# Patient Record
Sex: Female | Born: 1985 | Race: Black or African American | Hispanic: No | Marital: Married | Smoking: Never smoker
Health system: Southern US, Community
[De-identification: ages and names within clinical notes are randomized; demographics above are authoritative.]

## PROBLEM LIST (undated history)

## (undated) DIAGNOSIS — Z973 Presence of spectacles and contact lenses: Secondary | ICD-10-CM

## (undated) DIAGNOSIS — O169 Unspecified maternal hypertension, unspecified trimester: Secondary | ICD-10-CM

## (undated) HISTORY — DX: Presence of spectacles and contact lenses: Z97.3

## (undated) HISTORY — PX: TUBAL LIGATION: SHX77

## (undated) HISTORY — DX: Unspecified maternal hypertension, unspecified trimester: O16.9

---

## 2006-08-13 ENCOUNTER — Inpatient Hospital Stay (HOSPITAL_COMMUNITY): Admission: AD | Admit: 2006-08-13 | Discharge: 2006-08-18 | Payer: Self-pay | Admitting: Obstetrics and Gynecology

## 2006-08-13 ENCOUNTER — Encounter: Payer: Self-pay | Admitting: Obstetrics & Gynecology

## 2006-08-13 ENCOUNTER — Ambulatory Visit: Payer: Self-pay | Admitting: Pediatrics

## 2006-08-14 ENCOUNTER — Encounter: Payer: Self-pay | Admitting: Obstetrics & Gynecology

## 2006-08-15 ENCOUNTER — Encounter (INDEPENDENT_AMBULATORY_CARE_PROVIDER_SITE_OTHER): Payer: Self-pay | Admitting: Specialist

## 2006-08-15 ENCOUNTER — Encounter: Payer: Self-pay | Admitting: Obstetrics & Gynecology

## 2006-08-19 ENCOUNTER — Encounter: Admission: RE | Admit: 2006-08-19 | Discharge: 2006-09-18 | Payer: Self-pay | Admitting: Obstetrics and Gynecology

## 2006-09-19 ENCOUNTER — Encounter: Admission: RE | Admit: 2006-09-19 | Discharge: 2006-09-28 | Payer: Self-pay | Admitting: Obstetrics and Gynecology

## 2009-09-18 DIAGNOSIS — O169 Unspecified maternal hypertension, unspecified trimester: Secondary | ICD-10-CM

## 2009-09-18 HISTORY — DX: Unspecified maternal hypertension, unspecified trimester: O16.9

## 2009-11-25 ENCOUNTER — Ambulatory Visit (HOSPITAL_COMMUNITY): Admission: RE | Admit: 2009-11-25 | Discharge: 2009-11-25 | Payer: Self-pay | Admitting: Obstetrics and Gynecology

## 2009-12-10 ENCOUNTER — Ambulatory Visit (HOSPITAL_COMMUNITY): Admission: RE | Admit: 2009-12-10 | Discharge: 2009-12-10 | Payer: Self-pay | Admitting: Obstetrics and Gynecology

## 2010-01-06 ENCOUNTER — Ambulatory Visit (HOSPITAL_COMMUNITY): Admission: RE | Admit: 2010-01-06 | Discharge: 2010-01-06 | Payer: Self-pay | Admitting: Obstetrics and Gynecology

## 2010-03-07 ENCOUNTER — Emergency Department (HOSPITAL_COMMUNITY): Admission: EM | Admit: 2010-03-07 | Discharge: 2010-03-08 | Payer: Self-pay | Admitting: Emergency Medicine

## 2010-03-08 ENCOUNTER — Ambulatory Visit: Payer: Self-pay | Admitting: Nurse Practitioner

## 2010-03-09 ENCOUNTER — Inpatient Hospital Stay (HOSPITAL_COMMUNITY): Admission: AD | Admit: 2010-03-09 | Discharge: 2010-03-09 | Payer: Self-pay | Admitting: *Deleted

## 2010-03-10 ENCOUNTER — Ambulatory Visit (HOSPITAL_COMMUNITY): Admission: AD | Admit: 2010-03-10 | Discharge: 2010-03-10 | Payer: Self-pay | Admitting: Obstetrics and Gynecology

## 2010-03-14 ENCOUNTER — Ambulatory Visit (HOSPITAL_COMMUNITY): Admission: RE | Admit: 2010-03-14 | Discharge: 2010-03-14 | Payer: Self-pay | Admitting: Obstetrics and Gynecology

## 2010-03-22 ENCOUNTER — Inpatient Hospital Stay (HOSPITAL_COMMUNITY): Admission: AD | Admit: 2010-03-22 | Discharge: 2010-04-08 | Payer: Self-pay | Admitting: Obstetrics and Gynecology

## 2010-03-22 ENCOUNTER — Encounter: Payer: Self-pay | Admitting: Obstetrics and Gynecology

## 2010-03-25 ENCOUNTER — Encounter: Payer: Self-pay | Admitting: Obstetrics and Gynecology

## 2010-03-28 ENCOUNTER — Encounter: Payer: Self-pay | Admitting: Obstetrics and Gynecology

## 2010-03-31 ENCOUNTER — Encounter: Payer: Self-pay | Admitting: Obstetrics and Gynecology

## 2010-04-04 ENCOUNTER — Encounter: Payer: Self-pay | Admitting: Obstetrics and Gynecology

## 2010-04-05 ENCOUNTER — Encounter (INDEPENDENT_AMBULATORY_CARE_PROVIDER_SITE_OTHER): Payer: Self-pay | Admitting: Obstetrics and Gynecology

## 2010-04-08 ENCOUNTER — Encounter: Admission: RE | Admit: 2010-04-08 | Discharge: 2010-05-08 | Payer: Self-pay | Admitting: Obstetrics and Gynecology

## 2010-07-18 ENCOUNTER — Ambulatory Visit: Payer: Self-pay | Admitting: Family Medicine

## 2010-07-29 ENCOUNTER — Ambulatory Visit: Payer: Self-pay | Admitting: Family Medicine

## 2010-08-25 ENCOUNTER — Inpatient Hospital Stay (HOSPITAL_COMMUNITY): Admission: AD | Admit: 2010-08-25 | Discharge: 2010-03-08 | Payer: Self-pay | Admitting: Obstetrics and Gynecology

## 2010-08-25 ENCOUNTER — Inpatient Hospital Stay (HOSPITAL_COMMUNITY): Admission: AD | Admit: 2010-08-25 | Discharge: 2009-10-22 | Payer: Self-pay | Admitting: Obstetrics and Gynecology

## 2010-10-09 ENCOUNTER — Encounter: Payer: Self-pay | Admitting: Obstetrics & Gynecology

## 2010-12-03 LAB — CBC
HCT: 40.4 % (ref 36.0–46.0)
Hemoglobin: 12.1 g/dL (ref 12.0–15.0)
MCH: 32.4 pg (ref 26.0–34.0)
MCHC: 34.9 g/dL (ref 30.0–36.0)
MCV: 92.1 fL (ref 78.0–100.0)
RDW: 13.4 % (ref 11.5–15.5)
RDW: 13.7 % (ref 11.5–15.5)
WBC: 10 10*3/uL (ref 4.0–10.5)

## 2010-12-04 LAB — URINALYSIS, ROUTINE W REFLEX MICROSCOPIC
Bilirubin Urine: NEGATIVE
Bilirubin Urine: NEGATIVE
Glucose, UA: NEGATIVE mg/dL
Glucose, UA: NEGATIVE mg/dL
Glucose, UA: NEGATIVE mg/dL
Hgb urine dipstick: NEGATIVE
Ketones, ur: NEGATIVE mg/dL
Ketones, ur: NEGATIVE mg/dL
Ketones, ur: NEGATIVE mg/dL
Leukocytes, UA: NEGATIVE
Protein, ur: NEGATIVE mg/dL
Protein, ur: NEGATIVE mg/dL
pH: 7.5 (ref 5.0–8.0)
pH: 6 (ref 5.0–8.0)

## 2010-12-04 LAB — COMPREHENSIVE METABOLIC PANEL
ALT: 11 U/L (ref 0–35)
ALT: 12 U/L (ref 0–35)
ALT: 12 U/L (ref 0–35)
Albumin: 3.1 g/dL — ABNORMAL LOW (ref 3.5–5.2)
Alkaline Phosphatase: 202 U/L — ABNORMAL HIGH (ref 39–117)
BUN: 2 mg/dL — ABNORMAL LOW (ref 6–23)
BUN: 3 mg/dL — ABNORMAL LOW (ref 6–23)
BUN: 4 mg/dL — ABNORMAL LOW (ref 6–23)
CO2: 23 mEq/L (ref 19–32)
CO2: 25 mEq/L (ref 19–32)
Calcium: 8.4 mg/dL (ref 8.4–10.5)
Calcium: 8.5 mg/dL (ref 8.4–10.5)
Calcium: 8.7 mg/dL (ref 8.4–10.5)
Chloride: 107 mEq/L (ref 96–112)
Creatinine, Ser: 0.6 mg/dL (ref 0.4–1.2)
Creatinine, Ser: 0.61 mg/dL (ref 0.4–1.2)
GFR calc Af Amer: >60 mL/min (ref >60)
GFR calc non Af Amer: >60 mL/min (ref >60)
GFR calc non Af Amer: >60 mL/min (ref >60)
GFR calc non Af Amer: >60 mL/min (ref >60)
GFR calc non Af Amer: >60 mL/min (ref >60)
Glucose, Bld: 71 mg/dL (ref 70–99)
Glucose, Bld: 86 mg/dL (ref 70–99)
Glucose, Bld: 91 mg/dL (ref 70–99)
Potassium: 4.1 mEq/L (ref 3.5–5.1)
Sodium: 136 mEq/L (ref 135–145)
Sodium: 138 mEq/L (ref 135–145)
Sodium: 139 mEq/L (ref 135–145)
Total Bilirubin: 0.2 mg/dL — ABNORMAL LOW (ref 0.3–1.2)
Total Bilirubin: 0.5 mg/dL (ref 0.3–1.2)
Total Protein: 5.9 g/dL — ABNORMAL LOW (ref 6.0–8.3)

## 2010-12-04 LAB — LACTATE DEHYDROGENASE
LDH: 120 U/L (ref 94–250)
LDH: 192 U/L (ref 94–250)

## 2010-12-04 LAB — STREP B DNA PROBE

## 2010-12-04 LAB — CBC
HCT: 38.4 % (ref 36.0–46.0)
HCT: 38.8 % (ref 36.0–46.0)
HCT: 41.8 % (ref 36.0–46.0)
Hemoglobin: 13.3 g/dL (ref 12.0–15.0)
Hemoglobin: 13.4 g/dL (ref 12.0–15.0)
Hemoglobin: 15.3 g/dL — ABNORMAL HIGH (ref 12.0–15.0)
MCH: 31.6 pg (ref 26.0–34.0)
MCH: 31.8 pg (ref 26.0–34.0)
MCHC: 34.5 g/dL (ref 30.0–36.0)
MCHC: 34.5 g/dL (ref 30.0–36.0)
MCHC: 34.7 g/dL (ref 30.0–36.0)
MCHC: 34.8 g/dL (ref 30.0–36.0)
MCV: 91.5 fL (ref 78.0–100.0)
MCV: 91.8 fL (ref 78.0–100.0)
MCV: 90.1 fL (ref 78.0–100.0)
Platelets: 191 10*3/uL (ref 150–400)
RBC: 4.82 MIL/uL (ref 3.87–5.11)
RBC: 4.12 MIL/uL (ref 3.87–5.11)
RDW: 12.7 % (ref 11.5–15.5)
RDW: 13.2 % (ref 11.5–15.5)
WBC: 10.9 10*3/uL — ABNORMAL HIGH (ref 4.0–10.5)

## 2010-12-04 LAB — PROTEIN, URINE, RANDOM: Total Protein, Urine: <3 mg/dL

## 2010-12-04 LAB — CREATININE CLEARANCE, URINE, 24 HOUR
Collection Interval-CRCL: 24 hours
Creatinine: 0.61 mg/dL (ref 0.4–1.2)
Urine Total Volume-CRCL: 3175 mL

## 2010-12-04 LAB — PROTEIN, URINE, 24 HOUR
Collection Interval-UPROT: 24 hours
Protein, Urine: 3 mg/dL

## 2010-12-07 LAB — COMPREHENSIVE METABOLIC PANEL
Alkaline Phosphatase: 67 U/L (ref 39–117)
BUN: 5 mg/dL — ABNORMAL LOW (ref 6–23)
Chloride: 102 mEq/L (ref 96–112)
Glucose, Bld: 101 mg/dL — ABNORMAL HIGH (ref 70–99)
Potassium: 3.6 mEq/L (ref 3.5–5.1)
Total Bilirubin: 0.5 mg/dL (ref 0.3–1.2)

## 2010-12-07 LAB — CBC
HCT: 39.6 % (ref 36.0–46.0)
Hemoglobin: 13.5 g/dL (ref 12.0–15.0)
WBC: 13.9 10*3/uL — ABNORMAL HIGH (ref 4.0–10.5)

## 2010-12-07 LAB — URINALYSIS, ROUTINE W REFLEX MICROSCOPIC
Hgb urine dipstick: NEGATIVE
Protein, ur: NEGATIVE mg/dL
Urobilinogen, UA: 0.2 mg/dL (ref 0.0–1.0)

## 2011-02-03 NOTE — Op Note (Signed)
NAME:  Jodi Sanchez, Jodi Sanchez             ACCOUNT NO.:  [id number]   MEDICAL RECORD NO.:  [id number]          PATIENT TYPE:  OUT   LOCATION:  MFM                           FACILITY:  [facility]   PHYSICIAN:  Ilda Mori, M.D.   DATE OF BIRTH:  09/26/1985   DATE OF PROCEDURE:  DATE OF DISCHARGE:                               OPERATIVE REPORT   PREOPERATIVE DIAGNOSIS:  Intrauterine fetal growth restriction, reversed  flow umbilical cord Doppler studies and nonreassuring fetal heart rate  tracing.   POSTOPERATIVE DIAGNOSIS:  Intrauterine fetal growth restriction,  reversed flow umbilical cord Doppler studies and nonreassuring fetal  heart rate tracing.   PROCEDURE:  Primary low transverse cesarean section.   SURGEON:  Dr. Ilda Mori.   ANESTHESIA:  Spinal.   ESTIMATED BLOOD LOSS:  500 mL.   FINDINGS:  Female infant, Apgar scores 7 and 8, birth weight 1 pound 14  ounces (850 grams).  Arterial cord pH 7.21.   SPECIMENS:  Placenta to pathology.   INDICATIONS:  This is a 25 year old primigravid female who was admitted  to the hospital on November26 because of fetal growth restriction,  abnormal fetal cord Doppler studies and oligohydramnios.  The patient  was given a betamethasone protocol to enhance fetal lung maturity and  fetal heart tones were continuously monitored.  Repeat ultrasound showed  borderline oligohydramnios and persistent reverse flow on her umbilical  cord Doppler studies.  After maximum benefit from the steroid protocol  had been obtained the decision was made to deliver the baby due to  severe IUGR, persistent oligohydramnios, and reverse flow on her doppler  studies.  Because the fetal heart tones were reactive and her  biophysical profile were reassuring, a trial of induced labor was  attempted.  At around 1515 the nurse noted some variable decelerations  but due to the small uterine size her contractions were not recording on  external tocometric monitoring and  the timing of the decels could not be  defined.  Her cervix was examined and was found to be 1+ centimeters  dilated, 80% effaced with vertex well applied to the cervix at -1  station.  This was a significant change from the previous exam which was  a tight fingertip and 30%.  Artificial rupture of membranes was  performed which revealed clear fluid.  An IUPC was placed and a scalp  electrode was applied.  Following this severe variable decels occurred  and the the decision was made to proceed with immediate cesarean  section.  The pitocin was discontinued, terbutaline 0.25 mg was given  subcu and the patient was taken immediately to the operating room for  delivery.   PROCEDURE:  The patient was taken to the operating room, placed in  sitting position where a spinal anesthetic was placed.  Continuous fetal  monitoring during this procedure showed the fetal heart rates were  stable and in the 150-160 range with no decelerations and no uterine  contraction. The scalp electrode was then removed.  The abdomen was  prepped and draped in sterile fashion.  The bladder was catheterized.  A  low transverse  incision was made and carried down to the fascia which  was extended transversely.  The rectus sheath was then dissected  superiorly until the rectus midline could be identified.  This was then  opened and extended bluntly.  The peritoneal cavity was entered by sharp  and blunt dissection and extended bluntly.  The lower segment  identified.  A low transverse unterine incision was made and extended  bluntly.  The infant was then delivered without difficulty within the  amniotic sac.  Arterial cord pH and routine venous cord blood was  obtained. The placenta was then delivered with traction on cord and sent  for pathological evaluation.  The endometrial cavity was then bluntly  curettaged.  The lower segment was closed with single layer of running  interlocking Vicryl suture.  Hemostasis was  obtained with a vertical  mattress suture.  The adnexa were inspected and were normal.  The  perineum was then closed with a running 3-0 Vicryl suture which included  re-apposing the rectus muscle in the midline.  The fascia was then  closed with running 0 Vicryl suture and skin was closed with staples.  The patient tolerated the procedure well and left the operating room  good condition.      Ilda Mori, M.D.  Electronically Signed     RK/MEDQ  D:  08/15/2006  T:  08/16/2006  Job:  [id number]   cc:   Felipa Eth, MD

## 2011-02-03 NOTE — Discharge Summary (Signed)
Jodi Sanchez, Jodi Sanchez             ACCOUNT NO.:  [id number]   MEDICAL RECORD NO.:  [id number]          PATIENT TYPE:  INP   LOCATION:  9309                          FACILITY:  [facility]   PHYSICIAN:  Gerrit Friends. Aldona Bar, M.D.   DATE OF BIRTH:  May 30, 1986   DATE OF ADMISSION:  08/13/2006  DATE OF DISCHARGE:  08/18/2006                               DISCHARGE SUMMARY   DISCHARGE DIAGNOSIS:  1. A 29-week intrauterine pregnancy. Delivered 1 pound 14 ounce female      infant, Apgars 7 and 8.  2. Blood type O+.  3. Severe intrauterine growth restriction with placental      insufficiency.   PROCEDURE:  Primary low-transverse cesarean section.   SUMMARY:  This 25 year old, gravida 1, now para 1, with a due date of  October 23, 2006, based on an office ultrasound on June 08, 2006,  at which time the patient discovered that she was pregnant. Was admitted  by Dr. Edward Jolly to evaluate probable severe IUGR and oligohydramnios with  consultation with maternal fetal medicine.  The patient, as mentioned,  had late prenatal care: Did not really know she was pregnant until the  first office visit on June 08, 2006.  Subsequently, she was seen in  the office on October9, November6, and on the day of admission,  November26.  On the day of admission, she measured 25 cm at 29 weeks  and 6 days, and estimated fetal weight on ultrasound was less than 10th  percentile, and there was an amniotic fluid index that suggested  oligohydramnios.  She was admitted, seen by maternal fetal medicine, and  found to have reversal of end-diastolic flow, although her biophysical  profile was 8 of 8.  A course of steroids was given and completed on  November 28.  Daily biophysical profiles and Doppler studies were  unchanged.   Decision was made to proceed with delivery on September 28.  Amniotomy  was carried out, an IUPC was placed, and the patient was begun on  Pitocin; but she quickly developed some deep decelerations  with each  contraction, and Pitocin was discontinued. Terbutaline was given  subcutaneously, and the patient was taken to the operating room at which  time she underwent a primary low transverse cesarean section with  delivery of a 1 pound 14 ounce female infant with Apgar hours of 7 and  8.  Arterial cord pH was 7.21.  Placenta was sent to pathology.   The patient's post operative course was totally benign.  Her postop  hemoglobin was 11.5 with a white count 15,300 and a platelet count of  239,000.  She did very well post C-section and on the morning of  December 1 was ambulating well, tolerating a regular diet well, having  normal bowel and bladder function, was afebrile.  Her wound was clean  and dry and healing well. Her breast pumping was doing great, and after  arrangements with the nursery, she was desirous of discharge.  Accordingly, her staples were removed, and wound was Steri-Stripped.  She was given all appropriate instructions at the time of discharge and  understood all instructions well.   DISCHARGE MEDICATIONS:  1. Prenatal vitamins One-A-Day.  2. Tylox 1-2 every 4-6 hours as needed for severe pain.  3. Motrin 600 mg every 6 hours as needed for lesser pain or cramping.   She will return to the office followup in approximately four weeks' time  and was given all appropriate instructions at the time of discharge per  discharge brochure.   Baby was doing relatively well in the nursery considering gestational  age.   CONDITION ON DISCHARGE:  Improved.      Gerrit Friends. Aldona Bar, M.D.  Electronically Signed     RMW/MEDQ  D:  08/18/2006  T:  08/19/2006  Job:  [id number]   cc:   Felipa Eth, MD

## 2011-03-31 ENCOUNTER — Other Ambulatory Visit: Payer: Self-pay | Admitting: Obstetrics and Gynecology

## 2011-03-31 LAB — ANTIBODY SCREEN: Antibody Screen: NEGATIVE

## 2011-03-31 LAB — HEPATITIS B SURFACE ANTIGEN: Hepatitis B Surface Ag: NEGATIVE

## 2011-03-31 LAB — RPR: RPR: NONREACTIVE

## 2011-03-31 LAB — ABO/RH: RH Type: POSITIVE

## 2011-03-31 LAB — RUBELLA ANTIBODY, IGM: Rubella: IMMUNE

## 2011-05-31 ENCOUNTER — Encounter: Payer: Self-pay | Admitting: Family Medicine

## 2011-10-20 ENCOUNTER — Other Ambulatory Visit: Payer: Self-pay | Admitting: Obstetrics and Gynecology

## 2011-10-24 ENCOUNTER — Encounter (HOSPITAL_COMMUNITY): Payer: Self-pay | Admitting: Pharmacist

## 2011-10-24 ENCOUNTER — Other Ambulatory Visit: Payer: Self-pay | Admitting: Obstetrics and Gynecology

## 2011-10-31 ENCOUNTER — Encounter (HOSPITAL_COMMUNITY)
Admission: RE | Admit: 2011-10-31 | Discharge: 2011-10-31 | Disposition: A | Discharge status: Home / Self Care | Payer: BC Managed Care – PPO | Source: Ambulatory Visit | Attending: Obstetrics and Gynecology | Admitting: Obstetrics and Gynecology

## 2011-10-31 ENCOUNTER — Encounter (HOSPITAL_COMMUNITY): Payer: Self-pay

## 2011-10-31 LAB — CBC
HCT: 35.8 % — ABNORMAL LOW (ref 36.0–46.0)
Hemoglobin: 12.2 g/dL (ref 12.0–15.0)
MCV: 86.5 fL (ref 78.0–100.0)
RBC: 4.14 MIL/uL (ref 3.87–5.11)
WBC: 7.9 10*3/uL (ref 4.0–10.5)

## 2011-10-31 LAB — RPR: RPR Ser Ql: NONREACTIVE

## 2011-10-31 NOTE — Patient Instructions (Addendum)
20 Jodi Sanchez  10/31/2011   Your procedure is scheduled on:  11/01/11  Enter through the Main Entrance of [facility] at 10 AM.  Pick up the phone at the desk and dial [phone].   Call this number if you have problems the morning of surgery: [phone]   Remember:   Do not eat food:After Midnight.  Do not drink clear liquids: After Midnight.  Take these medicines the morning of surgery with A SIP OF WATER: NA   Do not wear jewelry, make-up or nail polish.  Do not wear lotions, powders, or perfumes. You may wear deodorant.  Do not shave 48 hours prior to surgery.  Do not bring valuables to the hospital.  Contacts, dentures or bridgework may not be worn into surgery.  Leave suitcase in the car. After surgery it may be brought to your room.  For patients admitted to the hospital, checkout time is 11:00 AM the day of discharge.   Patients discharged the day of surgery will not be allowed to drive home.  Name and phone number of your driver: NA  Special Instructions: CHG Shower Use Special Wash: 1/2 bottle night before surgery and 1/2 bottle morning of surgery.   Please read over the following fact sheets that you were given: MRSA Information

## 2011-11-01 ENCOUNTER — Encounter (HOSPITAL_COMMUNITY): Payer: Self-pay | Admitting: *Deleted

## 2011-11-01 ENCOUNTER — Inpatient Hospital Stay (HOSPITAL_COMMUNITY): Payer: BC Managed Care – PPO | Admitting: Anesthesiology

## 2011-11-01 ENCOUNTER — Encounter (HOSPITAL_COMMUNITY): Payer: Self-pay | Admitting: Anesthesiology

## 2011-11-01 ENCOUNTER — Inpatient Hospital Stay (HOSPITAL_COMMUNITY)
Admission: RE | Admit: 2011-11-01 | Discharge: 2011-11-04 | DRG: 370 | Disposition: A | Discharge status: Home / Self Care | Payer: BC Managed Care – PPO | Source: Ambulatory Visit | Attending: Obstetrics and Gynecology | Admitting: Obstetrics and Gynecology

## 2011-11-01 ENCOUNTER — Encounter (HOSPITAL_COMMUNITY)
Admission: RE | Disposition: A | Discharge status: Home / Self Care | Payer: Self-pay | Source: Ambulatory Visit | Attending: Obstetrics and Gynecology

## 2011-11-01 DIAGNOSIS — D649 Anemia, unspecified: Secondary | ICD-10-CM | POA: Diagnosis not present

## 2011-11-01 DIAGNOSIS — O9903 Anemia complicating the puerperium: Secondary | ICD-10-CM | POA: Diagnosis not present

## 2011-11-01 DIAGNOSIS — Z3480 Encounter for supervision of other normal pregnancy, unspecified trimester: Secondary | ICD-10-CM

## 2011-11-01 DIAGNOSIS — O34219 Maternal care for unspecified type scar from previous cesarean delivery: Principal | ICD-10-CM | POA: Diagnosis present

## 2011-11-01 DIAGNOSIS — Z01812 Encounter for preprocedural laboratory examination: Secondary | ICD-10-CM

## 2011-11-01 DIAGNOSIS — Z302 Encounter for sterilization: Secondary | ICD-10-CM

## 2011-11-01 DIAGNOSIS — Z01818 Encounter for other preprocedural examination: Secondary | ICD-10-CM

## 2011-11-01 LAB — TYPE AND SCREEN: Antibody Screen: NEGATIVE

## 2011-11-01 SURGERY — Surgical Case
Anesthesia: Spinal

## 2011-11-01 MED ORDER — OXYTOCIN 10 UNIT/ML IJ SOLN
INTRAMUSCULAR | Status: AC
  Filled 2011-11-01: qty 2

## 2011-11-01 MED ORDER — TETANUS-DIPHTH-ACELL PERTUSSIS 5-2.5-18.5 LF-MCG/0.5 IM SUSP: 0.5000 mL | Freq: Once | INTRAMUSCULAR | Status: DC

## 2011-11-01 MED ORDER — DIPHENHYDRAMINE HCL 50 MG/ML IJ SOLN: 25.0000 mg | INTRAMUSCULAR | Status: DC | PRN

## 2011-11-01 MED ORDER — SIMETHICONE 80 MG PO CHEW
80.0000 mg | CHEWABLE_TABLET | Freq: Three times a day (TID) | ORAL | Status: DC
  Administered 2011-11-01 – 2011-11-04 (×8): 80 mg via ORAL

## 2011-11-01 MED ORDER — IBUPROFEN 600 MG PO TABS
600.0000 mg | ORAL_TABLET | Freq: Four times a day (QID) | ORAL | Status: DC
  Administered 2011-11-02 – 2011-11-04 (×9): 600 mg via ORAL
  Filled 2011-11-01 (×4): qty 1

## 2011-11-01 MED ORDER — MEPERIDINE HCL 25 MG/ML IJ SOLN: 6.2500 mg | INTRAMUSCULAR | Status: DC | PRN

## 2011-11-01 MED ORDER — ZOLPIDEM TARTRATE 5 MG PO TABS: 5.0000 mg | ORAL_TABLET | Freq: Every evening | ORAL | Status: DC | PRN

## 2011-11-01 MED ORDER — KETOROLAC TROMETHAMINE 30 MG/ML IJ SOLN: 30.0000 mg | Freq: Four times a day (QID) | INTRAMUSCULAR | Status: AC | PRN

## 2011-11-01 MED ORDER — OXYTOCIN 20 UNITS IN LACTATED RINGERS INFUSION - SIMPLE
INTRAVENOUS | Status: DC | PRN
  Administered 2011-11-01 (×2): 20 [IU] via INTRAVENOUS

## 2011-11-01 MED ORDER — DIPHENHYDRAMINE HCL 25 MG PO CAPS: 25.0000 mg | ORAL_CAPSULE | Freq: Four times a day (QID) | ORAL | Status: DC | PRN

## 2011-11-01 MED ORDER — NALBUPHINE HCL 10 MG/ML IJ SOLN
5.0000 mg | INTRAMUSCULAR | Status: DC | PRN
  Filled 2011-11-01: qty 1

## 2011-11-01 MED ORDER — BUPIVACAINE IN DEXTROSE 0.75-8.25 % IT SOLN
INTRATHECAL | Status: DC | PRN
  Administered 2011-11-01: 1.2 mL via INTRATHECAL

## 2011-11-01 MED ORDER — SODIUM CHLORIDE 0.9 % IV SOLN
1.0000 ug/kg/h | INTRAVENOUS | Status: DC | PRN
  Filled 2011-11-01: qty 2.5

## 2011-11-01 MED ORDER — METHYLERGONOVINE MALEATE 0.2 MG/ML IJ SOLN: 0.2000 mg | INTRAMUSCULAR | Status: DC | PRN

## 2011-11-01 MED ORDER — DIBUCAINE 1 % RE OINT: 1.0000 "application " | TOPICAL_OINTMENT | RECTAL | Status: DC | PRN

## 2011-11-01 MED ORDER — FENTANYL CITRATE 0.05 MG/ML IJ SOLN: 25.0000 ug | INTRAMUSCULAR | Status: DC | PRN

## 2011-11-01 MED ORDER — CEFAZOLIN SODIUM 1-5 GM-% IV SOLN: 1.0000 g | INTRAVENOUS | Status: DC

## 2011-11-01 MED ORDER — DIPHENHYDRAMINE HCL 25 MG PO CAPS
25.0000 mg | ORAL_CAPSULE | ORAL | Status: DC | PRN
  Filled 2011-11-01: qty 1

## 2011-11-01 MED ORDER — LACTATED RINGERS IV SOLN
INTRAVENOUS | Status: DC
  Administered 2011-11-01 (×4): via INTRAVENOUS

## 2011-11-01 MED ORDER — MUPIROCIN 2 % EX OINT
TOPICAL_OINTMENT | CUTANEOUS | Status: AC
  Filled 2011-11-01: qty 22

## 2011-11-01 MED ORDER — PRENATAL MULTIVITAMIN CH
1.0000 | ORAL_TABLET | Freq: Every day | ORAL | Status: DC
  Administered 2011-11-02 – 2011-11-04 (×3): 1 via ORAL
  Filled 2011-11-01 (×3): qty 1

## 2011-11-01 MED ORDER — PHENYLEPHRINE HCL 10 MG/ML IJ SOLN
INTRAMUSCULAR | Status: DC | PRN
  Administered 2011-11-01: 80 ug via INTRAVENOUS
  Administered 2011-11-01: 40 ug via INTRAVENOUS
  Administered 2011-11-01 (×2): 80 ug via INTRAVENOUS
  Administered 2011-11-01 (×2): 40 ug via INTRAVENOUS

## 2011-11-01 MED ORDER — PHENYLEPHRINE 40 MCG/ML (10ML) SYRINGE FOR IV PUSH (FOR BLOOD PRESSURE SUPPORT)
PREFILLED_SYRINGE | INTRAVENOUS | Status: AC
  Filled 2011-11-01: qty 5

## 2011-11-01 MED ORDER — ONDANSETRON HCL 4 MG/2ML IJ SOLN
4.0000 mg | INTRAMUSCULAR | Status: DC | PRN
  Administered 2011-11-01: 4 mg via INTRAVENOUS
  Filled 2011-11-01: qty 2

## 2011-11-01 MED ORDER — SCOPOLAMINE 1 MG/3DAYS TD PT72
MEDICATED_PATCH | TRANSDERMAL | Status: AC
  Filled 2011-11-01: qty 1

## 2011-11-01 MED ORDER — OXYCODONE-ACETAMINOPHEN 5-325 MG PO TABS
2.0000 | ORAL_TABLET | Freq: Once | ORAL | Status: AC
  Administered 2011-11-01: 2 via ORAL

## 2011-11-01 MED ORDER — CEFAZOLIN SODIUM 1-5 GM-% IV SOLN
INTRAVENOUS | Status: AC
  Filled 2011-11-01: qty 50

## 2011-11-01 MED ORDER — NALOXONE HCL 0.4 MG/ML IJ SOLN: 0.4000 mg | INTRAMUSCULAR | Status: DC | PRN

## 2011-11-01 MED ORDER — ONDANSETRON HCL 4 MG/2ML IJ SOLN
INTRAMUSCULAR | Status: AC
  Filled 2011-11-01: qty 2

## 2011-11-01 MED ORDER — WITCH HAZEL-GLYCERIN EX PADS: 1.0000 "application " | MEDICATED_PAD | CUTANEOUS | Status: DC | PRN

## 2011-11-01 MED ORDER — LANOLIN HYDROUS EX OINT: 1.0000 "application " | TOPICAL_OINTMENT | CUTANEOUS | Status: DC | PRN

## 2011-11-01 MED ORDER — METHYLERGONOVINE MALEATE 0.2 MG PO TABS: 0.2000 mg | ORAL_TABLET | ORAL | Status: DC | PRN

## 2011-11-01 MED ORDER — CHLORHEXIDINE GLUCONATE CLOTH 2 % EX PADS
6.0000 | MEDICATED_PAD | Freq: Every day | CUTANEOUS | Status: DC
  Administered 2011-11-03 – 2011-11-04 (×2): 6 via TOPICAL

## 2011-11-01 MED ORDER — LACTATED RINGERS IV SOLN
INTRAVENOUS | Status: DC
  Administered 2011-11-01: 125 mL/h via INTRAVENOUS
  Administered 2011-11-01 – 2011-11-02 (×2): via INTRAVENOUS

## 2011-11-01 MED ORDER — SCOPOLAMINE 1 MG/3DAYS TD PT72
1.0000 | MEDICATED_PATCH | Freq: Once | TRANSDERMAL | Status: DC
  Administered 2011-11-01: 1.5 mg via TRANSDERMAL

## 2011-11-01 MED ORDER — MUPIROCIN 2 % EX OINT
1.0000 "application " | TOPICAL_OINTMENT | Freq: Two times a day (BID) | CUTANEOUS | Status: DC
  Administered 2011-11-01 – 2011-11-04 (×5): 1 via NASAL

## 2011-11-01 MED ORDER — FENTANYL CITRATE 0.05 MG/ML IJ SOLN
INTRAMUSCULAR | Status: DC | PRN
  Administered 2011-11-01: 12.5 ug via INTRATHECAL

## 2011-11-01 MED ORDER — OXYCODONE-ACETAMINOPHEN 5-325 MG PO TABS
1.0000 | ORAL_TABLET | ORAL | Status: DC | PRN
  Administered 2011-11-02 (×2): 1 via ORAL
  Filled 2011-11-01: qty 1
  Filled 2011-11-01: qty 2
  Filled 2011-11-01: qty 1

## 2011-11-01 MED ORDER — SENNOSIDES-DOCUSATE SODIUM 8.6-50 MG PO TABS
2.0000 | ORAL_TABLET | Freq: Every day | ORAL | Status: DC
  Administered 2011-11-01 – 2011-11-03 (×3): 2 via ORAL

## 2011-11-01 MED ORDER — CEFAZOLIN SODIUM 1-5 GM-% IV SOLN
1.0000 g | INTRAVENOUS | Status: AC
  Administered 2011-11-01: 1 g via INTRAVENOUS

## 2011-11-01 MED ORDER — IBUPROFEN 600 MG PO TABS
600.0000 mg | ORAL_TABLET | Freq: Four times a day (QID) | ORAL | Status: DC | PRN
  Filled 2011-11-01 (×4): qty 1

## 2011-11-01 MED ORDER — MENTHOL 3 MG MT LOZG: 1.0000 | LOZENGE | OROMUCOSAL | Status: DC | PRN

## 2011-11-01 MED ORDER — KETOROLAC TROMETHAMINE 30 MG/ML IJ SOLN
30.0000 mg | Freq: Four times a day (QID) | INTRAMUSCULAR | Status: AC | PRN
  Administered 2011-11-01: 30 mg via INTRAVENOUS
  Filled 2011-11-01: qty 1

## 2011-11-01 MED ORDER — DIPHENHYDRAMINE HCL 50 MG/ML IJ SOLN: 12.5000 mg | INTRAMUSCULAR | Status: DC | PRN

## 2011-11-01 MED ORDER — SIMETHICONE 80 MG PO CHEW
80.0000 mg | CHEWABLE_TABLET | ORAL | Status: DC | PRN
  Administered 2011-11-02 (×2): 80 mg via ORAL

## 2011-11-01 MED ORDER — OXYTOCIN 20 UNITS IN LACTATED RINGERS INFUSION - SIMPLE: 125.0000 mL/h | INTRAVENOUS | Status: AC

## 2011-11-01 MED ORDER — MORPHINE SULFATE (PF) 0.5 MG/ML IJ SOLN
INTRAMUSCULAR | Status: DC | PRN
  Administered 2011-11-01: 200 ug via INTRATHECAL

## 2011-11-01 MED ORDER — ONDANSETRON HCL 4 MG/2ML IJ SOLN
INTRAMUSCULAR | Status: DC | PRN
  Administered 2011-11-01: 4 mg via INTRAVENOUS

## 2011-11-01 MED ORDER — FENTANYL CITRATE 0.05 MG/ML IJ SOLN
INTRAMUSCULAR | Status: AC
  Filled 2011-11-01: qty 2

## 2011-11-01 MED ORDER — MORPHINE SULFATE 0.5 MG/ML IJ SOLN
INTRAMUSCULAR | Status: AC
  Filled 2011-11-01: qty 10

## 2011-11-01 MED ORDER — ONDANSETRON HCL 4 MG PO TABS: 4.0000 mg | ORAL_TABLET | ORAL | Status: DC | PRN

## 2011-11-01 MED ORDER — SODIUM CHLORIDE 0.9 % IJ SOLN: 3.0000 mL | INTRAMUSCULAR | Status: DC | PRN

## 2011-11-01 SURGICAL SUPPLY — 30 items
ADH SKN CLS APL DERMABOND .7 (GAUZE/BANDAGES/DRESSINGS) ×1
CLIP FILSHIE TUBAL LIGA STRL (Clip) ×1 IMPLANT
CLOTH BEACON ORANGE TIMEOUT ST (SAFETY) ×2 IMPLANT
DERMABOND ADVANCED (GAUZE/BANDAGES/DRESSINGS) ×1
DERMABOND ADVANCED .7 DNX12 (GAUZE/BANDAGES/DRESSINGS) IMPLANT
DRESSING TELFA 8X3 (GAUZE/BANDAGES/DRESSINGS) ×2 IMPLANT
ELECT REM PT RETURN 9FT ADLT (ELECTROSURGICAL) ×2
ELECTRODE REM PT RTRN 9FT ADLT (ELECTROSURGICAL) ×1 IMPLANT
EXTRACTOR VACUUM M CUP 4 TUBE (SUCTIONS) ×2 IMPLANT
GAUZE SPONGE 4X4 12PLY STRL LF (GAUZE/BANDAGES/DRESSINGS) ×4 IMPLANT
GLOVE BIO SURGEON STRL SZ7 (GLOVE) ×4 IMPLANT
GOWN PREVENTION PLUS LG XLONG (DISPOSABLE) ×4 IMPLANT
KIT ABG SYR 3ML LUER SLIP (SYRINGE) IMPLANT
NDL HYPO 25X5/8 SAFETYGLIDE (NEEDLE) IMPLANT
NEEDLE HYPO 25X5/8 SAFETYGLIDE (NEEDLE) IMPLANT
NS IRRIG 1000ML POUR BTL (IV SOLUTION) ×2 IMPLANT
PACK C SECTION WH (CUSTOM PROCEDURE TRAY) ×2 IMPLANT
PAD ABD 7.5X8 STRL (GAUZE/BANDAGES/DRESSINGS) ×2 IMPLANT
RTRCTR C-SECT PINK 25CM LRG (MISCELLANEOUS) IMPLANT
RTRCTR C-SECT PINK 34CM XLRG (MISCELLANEOUS) IMPLANT
SLEEVE SCD COMPRESS KNEE MED (MISCELLANEOUS) IMPLANT
STAPLER VISISTAT 35W (STAPLE) IMPLANT
SUT CHROMIC 1 CTX 36 (SUTURE) ×4 IMPLANT
SUT PDS AB 0 CTX 60 (SUTURE) ×2 IMPLANT
SUT VIC AB 2-0 CT1 27 (SUTURE) ×2
SUT VIC AB 2-0 CT1 TAPERPNT 27 (SUTURE) ×1 IMPLANT
SUT VIC AB 4-0 KS 27 (SUTURE) ×1 IMPLANT
TOWEL OR 17X24 6PK STRL BLUE (TOWEL DISPOSABLE) ×4 IMPLANT
TRAY FOLEY CATH 14FR (SET/KITS/TRAYS/PACK) ×2 IMPLANT
WATER STERILE IRR 1000ML POUR (IV SOLUTION) ×2 IMPLANT

## 2011-11-01 NOTE — Anesthesia Preprocedure Evaluation (Signed)
Anesthesia Evaluation  Patient identified by MRN, date of birth, ID band Patient awake    Reviewed: Allergy & Precautions, H&P , Patient's Chart, lab work & pertinent test results  Airway Mallampati: II TM Distance: >3 FB Neck ROM: Full    Dental No notable dental hx. (+) Teeth Intact   Pulmonary neg pulmonary ROS,  clear to auscultation  Pulmonary exam normal       Cardiovascular neg cardio ROS Regular Normal    Neuro/Psych Negative Neurological ROS  Negative Psych ROS   GI/Hepatic negative GI ROS, Neg liver ROS,   Endo/Other  Negative Endocrine ROS  Renal/GU negative Renal ROS  Genitourinary negative   Musculoskeletal   Abdominal Normal abdominal exam  (+)   Peds  Hematology negative hematology ROS (+)   Anesthesia Other Findings   Reproductive/Obstetrics (+) Pregnancy                           Anesthesia Physical Anesthesia Plan  ASA: II  Anesthesia Plan: Spinal   Post-op Pain Management:    Induction:   Airway Management Planned:   Additional Equipment:   Intra-op Plan:   Post-operative Plan:   Informed Consent: I have reviewed the patients History and Physical, chart, labs and discussed the procedure including the risks, benefits and alternatives for the proposed anesthesia with the patient or authorized representative who has indicated his/her understanding and acceptance.   Dental Advisory Given  Plan Discussed with: Anesthesiologist, Surgeon and CRNA  Anesthesia Plan Comments:         Anesthesia Quick Evaluation

## 2011-11-01 NOTE — Transfer of Care (Signed)
Immediate Anesthesia Transfer of Care Note  Patient: Jodi Sanchez  Procedure(s) Performed: Procedure(s) (LRB): CESAREAN SECTION WITH BILATERAL TUBAL LIGATION (N/A)  Patient Location: PACU  Anesthesia Type: Spinal  Level of Consciousness: awake, alert  and oriented  Airway & Oxygen Therapy: Patient Spontanous Breathing  Post-op Assessment: Report given to PACU RN and Post -op Vital signs reviewed and stable  Post vital signs: Reviewed and stable  Complications: No apparent anesthesia complications

## 2011-11-01 NOTE — Op Note (Signed)
Pre-Operative Diagnosis: 1) 39+1 week IUP 2) H/O prior Cesarean Section 3) Desired Permanent Sterilization Postoperative Diagnosis: Same Procedure: Repeat LTCS with bilateral tubal ligation with filshie clips Surgeon: Dr. Waynard Reeds Assistant: Dr. Miguel Aschoff Operative Findings: Vigorous female infant in vertex presentation weighing 5#15 with apgars 9/9.  Normal appearing ovaries, tubes, and uterus. Minimal intraabdominal scar tissue Specimen: Placenta for disposal EBL: Total I/O In: 2000 [I.V.:2000] Out: 800 [Urine:100; Blood:700]   Procedure:Jodi Sanchez is an 26 year old gravida 3 para 2 at 13 weeks and 1 days estimated gestational age who presents for cesarean section. Patient has a history of intrauterine growth restriction for her prior 2 pregnancies. During this pregnancy she was followed with serial growth scans for estimated fetal weight. Estimated fetal weight has always remained a 16% tile or greater during this pregnancy. The patient also desired permanent sterilization. The risks benefits and alternatives of the procedure were discussed with the patient at length prior to the procedure. Following the appropriate informed consent the patient was brought to the operating room where spinal anesthesia was administered and found to be adequate. She was placed in the dorsal supine position with a leftward tilt. A timeout procedure was performed and the patient was appropriately identified. She was prepped and draped in the normal sterile fashion. Scalpel was then used to make a Pfannenstiel skin incision which was carried down to the underlying layers of soft tissue to the fascia. The fascia was incised in the midline and the fascial incision was extended laterally with Mayo scissors. The superior aspect of the fascial incision was grasped with Coker clamps x2, tented up and the rectus muscles dissected off sharply with the electrocautery unit area and the same procedure was repeated on the inferior  aspect of the fascial incision. The rectus muscles were separated in the midline. The abdominal peritoneum was identified, tented up, entered sharply, and the incision was extended superiorly and inferiorly with good visualization of the bladder. The a Alexis retractor was then deployed. The vesicouterine peritoneum was identified, tented up, entered sharply, and the bladder flap was created digitally. Scalpel was then used to make a low transverse incision on the uterus which was extended laterally with blunt dissection. The fetal vertex was identified, delivered easily through the uterine incision followed by the body. The infant was bulb suctioned on the operative field cried vigorously, corpus length and cut and the infant was passed to the waiting neonatologist. Placenta was then delivered spontaneously, the uterus was cleared of all clot and debris. The uterine incision was repaired with #1 chromic in running locked fashion followed by a second imbricating layer. Ovaries and tubes were inspected and normal. Attention was turned to the tubal ligation portion of the procedure. The right tube was grasped with a Babcock clamp and tented up. A clear space in the mesial salpinx was identified and a Filshie clip was applied to the fallopian tube approximately 4 cm from the tubes insertion into the uterus. The clip was noted to completely encircle the fallopian tube. The same procedure was repeated on the left fallopian tube. The Alexis retractor was removed. The uterus was returned to the abdominal cavity the abdominal cavity was cleared of all clot and debris. The abdominal peritoneum was reapproximated with 2-0 Vicryl in a running fashion, the rectus muscles was reapproximated with #1 chromic in a running fashion. The fascia was closed with a looped PDS in a running fashion. The skin was closed with 4-0 Vicryl in a subcuticular fashion followed by  Dermabond. All sponge lap and needle counts were correct x2. Patient  tolerated the procedure well and recovered in stable condition following the procedure.

## 2011-11-01 NOTE — H&P (Signed)
Jodi Sanchez is a 26 y.o. female presenting for scheduled cesarean section at 39+1 Pt has h/o IUGR with 2 prior pregnancies with preterm deliveries at 46 and 36 weeks.  Pt has been followed with serial [place] during this pregnancy and EFW never declined below 16% with normal AFIs & NSTs.  She presents today for scheduled repeat c/s and tubal ligation for desired permanent sterilization.  R/B/A of the procedure were reviewed at length and pt wishes to proceed. History OB History    Grav Para Term Preterm Abortions TAB SAB Ect Mult Living   3 2 1 1      2      Past Medical History  Diagnosis Date  . No pertinent past medical history    Past Surgical History  Procedure Date  . Cesarean section     x2   Family History: family history is not on file. Social History:  reports that she has never smoked. She does not have any smokeless tobacco history on file. She reports that she does not drink alcohol or use illicit drugs.  ROS: as above    Blood pressure 114/78, pulse 87, temperature 98.2 F (36.8 C), temperature source Oral, resp. rate 16, last menstrual period 01/31/2011, SpO2 100.00%. Exam Physical Exam  Prenatal labs: ABO, Rh: --/--/O POS (02/13 1008) Antibody: NEG (02/13 1008) Rubella: Immune (07/13 1520) RPR: NON REACTIVE (02/12 1450)  HBsAg: Negative (07/13 1520)  HIV: Non-reactive (07/13 1520)  GBS:   neg  Assessment/Plan: Repeat c/s & BTL  Jashua Knaak H. 11/01/2011, 11:19 AM

## 2011-11-01 NOTE — Anesthesia Procedure Notes (Signed)
Spinal  Patient location during procedure: OR Start time: 11/01/2011 11:38 AM End time: 11/01/2011 11:41 AM Staffing Anesthesiologist: Sandrea Hughs Performed by: anesthesiologist  Preanesthetic Checklist Completed: patient identified, site marked, surgical consent, pre-op evaluation, timeout performed, IV checked, risks and benefits discussed and monitors and equipment checked Spinal Block Patient position: sitting Prep: DuraPrep Patient monitoring: heart rate, cardiac monitor, continuous pulse ox and blood pressure Approach: midline Location: L3-4 Injection technique: single-shot Needle Needle type: Sprotte  Needle gauge: 24 G Needle length: 9 cm Needle insertion depth: 6 cm Assessment Sensory level: T6

## 2011-11-01 NOTE — Brief Op Note (Signed)
11/01/2011  12:34 PM  PATIENT:  Jodi Sanchez  26 y.o. female  PRE-OPERATIVE DIAGNOSIS:  REPEAT DESIRES STERILIZATION  POST-OPERATIVE DIAGNOSIS:  REPEAT DESIRES STERILIZATION  PROCEDURE:  Procedure(s) (LRB): CESAREAN SECTION WITH BILATERAL TUBAL LIGATION (N/A)  SURGEON:  Surgeon(s) and Role:    * Kalany Diekmann H. Tenny Craw, MD - Primary    * Miguel Aschoff, MD - Assisting  PHYSICIAN ASSISTANT:   ASSISTANTS: as above   ANESTHESIA:   spinal  EBL:  Total I/O In: 2000 [I.V.:2000] Out: 800 [Urine:100; Blood:700]  BLOOD ADMINISTERED:none  DRAINS: Urinary Catheter (Foley)   LOCAL MEDICATIONS USED:  NONE  SPECIMEN:  Source of Specimen:  Placenta for disposal  DISPOSITION OF SPECIMEN:  disposal  COUNTS:  YES  TOURNIQUET:  * No tourniquets in log *  DICTATION: .Dragon Dictation  PLAN OF CARE: Admit to inpatient   PATIENT DISPOSITION:  PACU - hemodynamically stable.   Delay start of Pharmacological VTE agent (>24hrs) due to surgical blood loss or risk of bleeding: no

## 2011-11-01 NOTE — Addendum Note (Signed)
Addendum  created 11/01/11 1858 by Sandrea Hughs., MD   Modules edited:Orders

## 2011-11-01 NOTE — Anesthesia Postprocedure Evaluation (Signed)
Anesthesia Post Note  Patient: Jodi Sanchez  Procedure(s) Performed: Procedure(s) (LRB): CESAREAN SECTION WITH BILATERAL TUBAL LIGATION (N/A)  Anesthesia type: Spinal  Patient location: PACU  Post pain: Pain level controlled  Post assessment: Post-op Vital signs reviewed  Last Vitals:  Filed Vitals:   11/01/11 1300  BP: 105/55  Pulse: 71  Temp:   Resp: 16    Post vital signs: Reviewed  Level of consciousness: awake  Complications: No apparent anesthesia complications

## 2011-11-02 LAB — CBC
HCT: 20.4 % — ABNORMAL LOW (ref 36.0–46.0)
Hemoglobin: 6.9 g/dL — CL (ref 12.0–15.0)
MCV: 86.8 fL (ref 78.0–100.0)
RBC: 2.35 MIL/uL — ABNORMAL LOW (ref 3.87–5.11)
WBC: 7.1 10*3/uL (ref 4.0–10.5)

## 2011-11-02 MED ORDER — FERROUS SULFATE 325 (65 FE) MG PO TABS
325.0000 mg | ORAL_TABLET | Freq: Three times a day (TID) | ORAL | Status: DC
  Administered 2011-11-02 – 2011-11-04 (×6): 325 mg via ORAL
  Filled 2011-11-02 (×6): qty 1

## 2011-11-02 MED ORDER — DOCUSATE SODIUM 100 MG PO CAPS: 100.0000 mg | ORAL_CAPSULE | Freq: Two times a day (BID) | ORAL | Status: DC | PRN

## 2011-11-02 NOTE — Addendum Note (Signed)
Addendum  created 11/02/11 1610 by Pat Patrick, CRNA   Modules edited:Notes Section

## 2011-11-02 NOTE — Progress Notes (Signed)
POD #1 Patient is eating, ambulating, voiding.  Pain control is good. Bleeding minimal. Denies lightheadedness or other complaints.  Filed Vitals:   11/01/11 2340 11/02/11 0400 11/02/11 0615 11/02/11 0800  BP: 109/71 102/63 101/62   Pulse: 108 89 86 84  Temp: 98.2 F (36.8 C) 98.2 F (36.8 C) 98.1 F (36.7 C)   TempSrc: Oral Oral Oral   Resp: 18 18 18 16   Weight:      SpO2: 98% 98% 98% 100%   Inc: c/d/i Fundus firm @ umbilicus No CT  Lab Results  Component Value Date   WBC 7.1 11/02/2011   HGB 6.9* 11/02/2011   HCT 20.4* 11/02/2011   MCV 86.8 11/02/2011   PLT 194 11/02/2011    --/--/O POS (02/13 1008)  A/P Post-op day #1 s/p repeat c/s with BTL Pt doing well without complaint. Post op anemia - start FeSO4 325mg  tid with colace bid Routine care.    Jodi Sanchez

## 2011-11-02 NOTE — Anesthesia Postprocedure Evaluation (Signed)
  Anesthesia Post-op Note  Patient: Jodi Sanchez  Procedure(s) Performed: Procedure(s) (LRB): CESAREAN SECTION WITH BILATERAL TUBAL LIGATION (N/A)  Patient Location: PACU and Women's Unit  Anesthesia Type: Spinal  Level of Consciousness: awake, alert  and oriented  Airway and Oxygen Therapy: Patient Spontanous Breathing  Post-op Pain: mild  Post-op Assessment: Post-op Vital signs reviewed and Patient's Cardiovascular Status Stable  Post-op Vital Signs: Reviewed and stable  Complications: No apparent anesthesia complications

## 2011-11-03 LAB — CBC
HCT: 20.8 % — ABNORMAL LOW (ref 36.0–46.0)
Hemoglobin: 7.2 g/dL — ABNORMAL LOW (ref 12.0–15.0)
MCH: 30.4 pg (ref 26.0–34.0)
MCHC: 34.6 g/dL (ref 30.0–36.0)
RBC: 2.37 MIL/uL — ABNORMAL LOW (ref 3.87–5.11)

## 2011-11-03 NOTE — Progress Notes (Signed)
POD#2 Pt without c/o. States lochia is light. Offered discharge and pt declined. IMP/ anemic PLAN/ Will check CBC.

## 2011-11-04 MED ORDER — FERROUS SULFATE 325 (65 FE) MG PO TABS: 325.0000 mg | ORAL_TABLET | Freq: Two times a day (BID) | ORAL | Status: DC

## 2011-11-04 MED ORDER — OXYCODONE-ACETAMINOPHEN 5-325 MG PO TABS: 1.0000 | ORAL_TABLET | ORAL | Status: AC | PRN

## 2011-11-04 NOTE — Progress Notes (Signed)
POD#3 Pt without complaints.  Pt has been tachy for the last 30 hours.  Has known anemia, Has had no fever or difficulty with ambulation. On PE the patient had an edematous mons and labia.  It is likely that the location of the blood loss is in this area.  Incision appears to be healing well. PLAN/ Will discharge to home and follow up in the office next week.

## 2011-11-05 NOTE — Discharge Summary (Signed)
NAME:  Jodi Sanchez, Jodi Sanchez             ACCOUNT NO.:  [id number]  MEDICAL RECORD NO.:  [id number]  LOCATION:  9124                          FACILITY:  [facility]  PHYSICIAN:  Malva Limes, M.D.    DATE OF BIRTH:  [birth year]  DATE OF ADMISSION:  11/01/2011 DATE OF DISCHARGE:  11/04/2011                              DISCHARGE SUMMARY   PRINCIPAL DISCHARGE DIAGNOSES: 1. Intrauterine pregnancy at term. 2. History of prior cesarean section x2. 3. The patient desires permanent sterilization. 4. Anemia.  PRINCIPAL PROCEDURES: 1. Repeat cesarean section. 2. Bilateral tubal ligation.  HISTORY OF PRESENT ILLNESS:  Ms. Tugwell is a 26 year old black female, G3, now P3 who presented to [facility] for repeat cesarean section and bilateral tubal ligation on November 01, 2011.  A complete history of the events which led to this admission can be found in dictated history and physical.  The patient underwent a repeat cesarean section and bilateral tubal ligation.  A complete description of this can be found dictated in operative note.  The patient's postoperative course was complicated by significant anemia.  Her postop hemoglobin was 6.9 and preop was 12.2.  Despite this anemia, the patient ambulated without difficulty.  She was tachycardic but was asymptomatic.  It was also noted that the patient had significant edema and discoloration of her mons and bilateral labia.  It was felt that this most likely was the location of the blood loss.  The patient was discharged home on postop day #3, she was ambulating without difficulty.  Her incision appeared to be healing well.  She was sent home with Percocet to take p.r.n. Instructed to follow up in the office in 1 week.  She is also told to continue taking her iron b.i.d.  She was told to call with any significant fever, significant vaginal bleeding, or pain.          ______________________________ Malva Limes, M.D.     MA/MEDQ  D:  11/04/2011   T:  11/05/2011  Job:  [id number]

## 2011-12-08 ENCOUNTER — Ambulatory Visit: Payer: BC Managed Care – PPO | Admitting: Family Medicine

## 2011-12-18 ENCOUNTER — Encounter (HOSPITAL_COMMUNITY)
Admission: RE | Admit: 2011-12-18 | Discharge: 2011-12-18 | Disposition: A | Discharge status: Home / Self Care | Payer: BC Managed Care – PPO | Source: Ambulatory Visit | Attending: Obstetrics and Gynecology | Admitting: Obstetrics and Gynecology

## 2011-12-18 DIAGNOSIS — O923 Agalactia: Secondary | ICD-10-CM | POA: Insufficient documentation

## 2012-04-22 ENCOUNTER — Encounter: Payer: BC Managed Care – PPO | Admitting: Medical

## 2012-04-29 ENCOUNTER — Encounter: Payer: Self-pay | Admitting: Medical

## 2012-04-29 ENCOUNTER — Ambulatory Visit (INDEPENDENT_AMBULATORY_CARE_PROVIDER_SITE_OTHER): Payer: BC Managed Care – PPO | Admitting: Medical

## 2012-04-29 VITALS — BP 108/80 | HR 88 | Temp 98.0°F | Resp 16 | Ht 63.0 in | Wt 122.0 lb

## 2012-04-29 DIAGNOSIS — Z139 Encounter for screening, unspecified: Secondary | ICD-10-CM

## 2012-04-29 DIAGNOSIS — D649 Anemia, unspecified: Secondary | ICD-10-CM

## 2012-04-29 DIAGNOSIS — Z0000 Encounter for general adult medical examination without abnormal findings: Secondary | ICD-10-CM

## 2012-04-29 DIAGNOSIS — Z111 Encounter for screening for respiratory tuberculosis: Secondary | ICD-10-CM

## 2012-04-29 LAB — POCT URINALYSIS DIPSTICK
Bilirubin, UA: NEGATIVE
Ketones, UA: NEGATIVE
Protein, UA: NEGATIVE
Spec Grav, UA: 1.02
pH, UA: 5

## 2012-04-29 NOTE — Addendum Note (Signed)
Addended by: Janeice Robinson on: 04/29/2012 09:25 AM   Modules accepted: Orders
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
## 2012-04-29 NOTE — Patient Instructions (Signed)

## 2012-04-30 LAB — LIPID PANEL
Cholesterol: 190 mg/dL (ref 0–200)
HDL: 84 mg/dL (ref >39)
Total CHOL/HDL Ratio: 2.3 Ratio
Triglycerides: 38 mg/dL (ref <150)

## 2012-04-30 LAB — COMPREHENSIVE METABOLIC PANEL
ALT: 10 U/L (ref 0–35)
CO2: 26 mEq/L (ref 19–32)
Calcium: 9.7 mg/dL (ref 8.4–10.5)
Chloride: 101 mEq/L (ref 96–112)
Creat: 0.79 mg/dL (ref 0.50–1.10)
Glucose, Bld: 77 mg/dL (ref 70–99)
Sodium: 138 mEq/L (ref 135–145)
Total Bilirubin: 0.4 mg/dL (ref 0.3–1.2)
Total Protein: 7.6 g/dL (ref 6.0–8.3)

## 2012-04-30 LAB — MEASLES/MUMPS/RUBELLA IMMUNITY
Mumps IgG: 1.85 {ISR} — ABNORMAL HIGH
Rubeola IgG: 4.83 {ISR} — ABNORMAL HIGH

## 2012-04-30 LAB — CBC WITH DIFFERENTIAL/PLATELET
Basophils Absolute: 0 10*3/uL (ref 0.0–0.1)
Lymphocytes Relative: 26 % (ref 12–46)
Lymphs Abs: 1.3 10*3/uL (ref 0.7–4.0)
Neutro Abs: 2.9 10*3/uL (ref 1.7–7.7)
Platelets: 306 10*3/uL (ref 150–400)
RBC: 4.5 MIL/uL (ref 3.87–5.11)
RDW: 14.4 % (ref 11.5–15.5)
WBC: 4.9 10*3/uL (ref 4.0–10.5)

## 2012-05-08 ENCOUNTER — Encounter: Payer: Self-pay | Admitting: Family Medicine

## 2012-10-02 ENCOUNTER — Ambulatory Visit (INDEPENDENT_AMBULATORY_CARE_PROVIDER_SITE_OTHER): Payer: BC Managed Care – PPO | Admitting: Family Medicine

## 2012-10-02 ENCOUNTER — Encounter: Payer: Self-pay | Admitting: Family Medicine

## 2012-10-02 VITALS — BP 108/64 | HR 72 | Temp 99.1°F | Ht 63.0 in | Wt 130.0 lb

## 2012-10-02 DIAGNOSIS — R52 Pain, unspecified: Secondary | ICD-10-CM

## 2012-10-02 DIAGNOSIS — J069 Acute upper respiratory infection, unspecified: Secondary | ICD-10-CM

## 2012-10-02 DIAGNOSIS — R509 Fever, unspecified: Secondary | ICD-10-CM

## 2012-10-02 LAB — POCT INFLUENZA A/B
Influenza A, POC: NEGATIVE
Influenza B, POC: NEGATIVE

## 2012-10-02 NOTE — Patient Instructions (Signed)
Try decongestants and/or sinus rinses/Neti-pot.  Continue guaifenesin.  Call or return next week if ongoing/worsening symptoms, especially with discolored mucus, fevers, sinus pain.  Try tylenol, or ibuprofen or aleve as needed for fevers and muscle aches.
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
## 2013-02-07 ENCOUNTER — Emergency Department (HOSPITAL_COMMUNITY): Payer: BC Managed Care – PPO

## 2013-02-07 ENCOUNTER — Encounter (HOSPITAL_COMMUNITY): Payer: Self-pay | Admitting: Emergency Medicine

## 2013-02-07 DIAGNOSIS — Z8742 Personal history of other diseases of the female genital tract: Secondary | ICD-10-CM | POA: Insufficient documentation

## 2013-02-07 DIAGNOSIS — R0789 Other chest pain: Secondary | ICD-10-CM | POA: Insufficient documentation

## 2013-02-07 DIAGNOSIS — Z3202 Encounter for pregnancy test, result negative: Secondary | ICD-10-CM | POA: Insufficient documentation

## 2013-02-07 DIAGNOSIS — H547 Unspecified visual loss: Secondary | ICD-10-CM | POA: Insufficient documentation

## 2013-02-07 LAB — CBC
HCT: 36.9 % (ref 36.0–46.0)
Hemoglobin: 12.9 g/dL (ref 12.0–15.0)
MCH: 29.5 pg (ref 26.0–34.0)
MCHC: 35 g/dL (ref 30.0–36.0)
MCV: 84.4 fL (ref 78.0–100.0)
RBC: 4.37 MIL/uL (ref 3.87–5.11)

## 2013-02-07 LAB — BASIC METABOLIC PANEL
BUN: 10 mg/dL (ref 6–23)
CO2: 24 mEq/L (ref 19–32)
Chloride: 103 mEq/L (ref 96–112)
Glucose, Bld: 100 mg/dL — ABNORMAL HIGH (ref 70–99)
Potassium: 4 mEq/L (ref 3.5–5.1)

## 2013-02-07 MED ORDER — NITROGLYCERIN 0.4 MG SL SUBL: 0.4000 mg | SUBLINGUAL_TABLET | SUBLINGUAL | Status: DC | PRN

## 2013-02-07 MED ORDER — ASPIRIN 325 MG PO TABS
325.0000 mg | ORAL_TABLET | ORAL | Status: AC
  Administered 2013-02-08: 325 mg via ORAL
  Filled 2013-02-07: qty 1

## 2013-02-07 NOTE — ED Notes (Signed)
PT. REPORTS INTERMITTENT LEFT CHEST PAIN FOR SEVERAL WEEKS , DENIES SOB OR NAUSEA , NO INJURY OR DIAPHORESIS.

## 2013-02-08 ENCOUNTER — Emergency Department (HOSPITAL_COMMUNITY)
Admission: EM | Admit: 2013-02-08 | Discharge: 2013-02-08 | Disposition: A | Discharge status: Home / Self Care | Payer: BC Managed Care – PPO | Attending: Emergency Medicine | Admitting: Emergency Medicine

## 2013-02-08 DIAGNOSIS — R0789 Other chest pain: Secondary | ICD-10-CM

## 2013-02-08 NOTE — ED Provider Notes (Signed)
Medical screening examination/treatment/procedure(s) were performed by non-physician practitioner and as supervising physician I was immediately available for consultation/collaboration.  Lyanne Co, MD 02/08/13 [phone]

## 2013-02-08 NOTE — ED Notes (Signed)
PA at bedside.

## 2013-02-08 NOTE — ED Notes (Signed)
Pt reports when she sneezes or coughs, she can feel a knot in her abdomen. Pt states that it is not painful, and that it eventually goes away.
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
## 2013-02-08 NOTE — ED Notes (Signed)
Pt denies pain at this time, but pt states when she has the episodes of chest pressure, the pain is about a 6/10.

## 2013-02-12 ENCOUNTER — Encounter: Payer: Self-pay | Admitting: Medical

## 2013-02-12 ENCOUNTER — Ambulatory Visit (INDEPENDENT_AMBULATORY_CARE_PROVIDER_SITE_OTHER): Payer: BC Managed Care – PPO | Admitting: Medical

## 2013-02-12 VITALS — BP 100/70 | HR 82 | Temp 98.2°F | Resp 16 | Wt 131.0 lb

## 2013-02-12 DIAGNOSIS — R079 Chest pain, unspecified: Secondary | ICD-10-CM

## 2013-02-12 DIAGNOSIS — Z111 Encounter for screening for respiratory tuberculosis: Secondary | ICD-10-CM
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
## 2013-02-14 LAB — TB SKIN TEST
Induration: 0 mm
TB Skin Test: NEGATIVE

## 2013-05-16 ENCOUNTER — Encounter: Payer: Self-pay | Admitting: Medical

## 2013-05-16 ENCOUNTER — Ambulatory Visit (INDEPENDENT_AMBULATORY_CARE_PROVIDER_SITE_OTHER): Payer: BC Managed Care – PPO | Admitting: Medical

## 2013-05-16 VITALS — BP 110/68 | HR 103 | Temp 98.2°F | Resp 16 | Wt 130.0 lb

## 2013-05-16 DIAGNOSIS — R140 Abdominal distension (gaseous): Secondary | ICD-10-CM

## 2013-05-16 DIAGNOSIS — R109 Unspecified abdominal pain: Secondary | ICD-10-CM

## 2013-05-16 DIAGNOSIS — R141 Gas pain: Secondary | ICD-10-CM

## 2013-05-16 DIAGNOSIS — R112 Nausea with vomiting, unspecified: Secondary | ICD-10-CM

## 2013-05-16 LAB — POCT URINALYSIS DIPSTICK
Bilirubin, UA: NEGATIVE
Glucose, UA: NEGATIVE
Leukocytes, UA: NEGATIVE
Nitrite, UA: NEGATIVE
Urobilinogen, UA: NEGATIVE
pH, UA: 5
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
## 2013-05-16 NOTE — Addendum Note (Signed)
Addended by: Janeice Robinson on: 05/16/2013 04:30 PM   Modules accepted: Orders

## 2013-05-16 NOTE — Patient Instructions (Signed)
Constipation in Adults Constipation is having fewer than 2 bowel movements per week. Usually, the stools are hard. As we grow older, constipation is more common. If you try to fix constipation with laxatives, the problem may get worse. This is because laxatives taken over a long period of time make the colon muscles weaker. A low-fiber diet, not taking in enough fluids, and taking some medicines may make these problems worse. MEDICATIONS THAT MAY CAUSE CONSTIPATION  Water pills (diuretics).   Calcium channel blockers (used to control blood pressure and for the heart).   Certain pain medicines (narcotics).   Anticholinergics.   Anti-inflammatory agents.   Antacids that contain aluminum.  DISEASES THAT CONTRIBUTE TO CONSTIPATION  Diabetes.   Parkinson's disease.   Dementia.   Stroke.   Depression.   Illnesses that cause problems with salt and water metabolism.  HOME CARE INSTRUCTIONS   Constipation is usually best cared for without medicines. Increasing dietary fiber and eating more fruits and vegetables is the best way to manage constipation.   Slowly increase fiber intake to 25 to 38 grams per day. Whole grains, fruits, vegetables, and legumes are good sources of fiber. A dietitian can further help you incorporate high-fiber foods into your diet.   Drink enough water and fluids to keep your urine clear or pale yellow.   A fiber supplement may be added to your diet if you cannot get enough fiber from foods.   Increasing your activities also helps improve regularity.   Suppositories, as suggested by your caregiver, will also help. If you are using antacids, such as aluminum or calcium containing products, it will be helpful to switch to products containing magnesium if your caregiver says it is okay.   If you have been given a liquid injection (enema) today, this is only a temporary measure. It should not be relied on for treatment of longstanding (chronic) constipation.    Stronger measures, such as magnesium sulfate, should be avoided if possible. This may cause uncontrollable diarrhea. Using magnesium sulfate may not allow you time to make it to the bathroom.  SEEK IMMEDIATE MEDICAL CARE IF:   There is bright red blood in the stool.   The constipation stays for more than 4 days.   There is belly (abdominal) or rectal pain.   You do not seem to be getting better.   You have any questions or concerns.  MAKE SURE YOU:   Understand these instructions.   Will watch your condition.   Will get help right away if you are not doing well or get worse.  Document Released: 06/02/2004 Document Revised: 05/17/2011 Document Reviewed: 04/22/2008 [employer] Patient Information 2012 [place], [place].    Appendicitis Appendicitis is when the appendix is swollen (inflamed). The inflammation can lead to developing a hole (perforation) and a collection of pus (abscess). CAUSES  There is not always an obvious cause of appendicitis. Sometimes it is caused by an obstruction in the appendix. The obstruction can be caused by:  A small, hard, pea-sized ball of stool (fecalith).  Enlarged lymph glands in the appendix. SYMPTOMS   Pain around your belly button (navel) that moves toward your lower right belly (abdomen). The pain can become more severe and sharp as time passes.  Tenderness in the lower right abdomen. Pain gets worse if you cough or make a sudden movement.  Feeling sick to your stomach (nauseous).  Throwing up (vomiting).  Loss of appetite.  Fever.  Constipation.  Diarrhea.  Generally not feeling well.  DIAGNOSIS   Physical exam.  Blood tests.  Urine test.  X-rays or a CT scan may confirm the diagnosis. TREATMENT  Once the diagnosis of appendicitis is made, the most common treatment is to remove the appendix as soon as possible. This procedure is called appendectomy. In an open appendectomy, a cut (incision) is made in the lower right  abdomen and the appendix is removed. In a laparoscopic appendectomy, usually 3 small incisions are made. Long, thin instruments and a camera tube are used to remove the appendix. Most patients go home in 24 to 48 hours after appendectomy. In some situations, the appendix may have already perforated and an abscess may have formed. The abscess may have a "wall" around it as seen on a CT scan. In this case, a drain may be placed into the abscess to remove fluid, and you may be treated with antibiotic medicines that kill germs. The medicine is given through a tube in your vein (IV). Once the abscess has resolved, it may or may not be necessary to have an appendectomy. You may need to stay in the hospital longer than 48 hours. Document Released: 09/04/2005 Document Revised: 03/05/2012 Document Reviewed: 11/30/2009 [facility] Patient Information 2014 [place], [place].

## 2013-05-20 ENCOUNTER — Ambulatory Visit: Payer: BC Managed Care – PPO | Admitting: Medical

## 2013-06-24 ENCOUNTER — Other Ambulatory Visit: Payer: Self-pay | Admitting: Obstetrics and Gynecology

## 2013-07-04 ENCOUNTER — Encounter: Payer: BC Managed Care – PPO | Admitting: Medical

## 2014-07-14 ENCOUNTER — Other Ambulatory Visit: Payer: Self-pay | Admitting: Obstetrics and Gynecology

## 2014-07-15 LAB — CYTOLOGY - PAP

## 2014-07-20 ENCOUNTER — Encounter: Payer: Self-pay | Admitting: Medical

## 2014-09-22 ENCOUNTER — Emergency Department: Payer: Self-pay | Admitting: Emergency Medicine

## 2014-09-22 LAB — BASIC METABOLIC PANEL
Anion Gap: 7 (ref 7–16)
BUN: 9 mg/dL (ref 7–18)
CALCIUM: 8.8 mg/dL (ref 8.5–10.1)
Chloride: 103 mmol/L (ref 98–107)
Co2: 28 mmol/L (ref 21–32)
Creatinine: 0.92 mg/dL (ref 0.60–1.30)
GLUCOSE: 85 mg/dL (ref 65–99)
OSMOLALITY: 274 (ref 275–301)
POTASSIUM: 3.7 mmol/L (ref 3.5–5.1)
SODIUM: 138 mmol/L (ref 136–145)

## 2014-09-22 LAB — CBC
HCT: 37.8 % (ref 35.0–47.0)
HGB: 12.5 g/dL (ref 12.0–16.0)
MCH: 29.2 pg (ref 26.0–34.0)
MCHC: 33.1 g/dL (ref 32.0–36.0)
MCV: 89 fL (ref 80–100)
Platelet: 243 10*3/uL (ref 150–440)
RBC: 4.27 10*6/uL (ref 3.80–5.20)
RDW: 12.3 % (ref 11.5–14.5)
WBC: 6.2 10*3/uL (ref 3.6–11.0)

## 2014-09-22 LAB — TROPONIN I: Troponin-I: <0.02 ng/mL

## 2014-09-23 LAB — D-DIMER(ARMC): D-DIMER: 140 ng/mL

## 2014-09-23 LAB — TROPONIN I: Troponin-I: <0.02 ng/mL

## 2014-09-25 ENCOUNTER — Telehealth: Payer: Self-pay | Admitting: Medical

## 2014-09-25 ENCOUNTER — Encounter: Payer: Self-pay | Admitting: Medical

## 2014-09-25 ENCOUNTER — Ambulatory Visit (INDEPENDENT_AMBULATORY_CARE_PROVIDER_SITE_OTHER): Payer: BC Managed Care – PPO | Admitting: Medical

## 2014-09-25 VITALS — BP 100/80 | HR 73 | Temp 98.1°F | Resp 16 | Wt 140.0 lb

## 2014-09-25 DIAGNOSIS — R0789 Other chest pain: Secondary | ICD-10-CM

## 2014-09-25 DIAGNOSIS — R079 Chest pain, unspecified: Secondary | ICD-10-CM

## 2014-09-25 DIAGNOSIS — Z566 Other physical and mental strain related to work: Secondary | ICD-10-CM

## 2014-09-25 MED ORDER — ALPRAZOLAM 0.25 MG PO TABS: 0.2500 mg | ORAL_TABLET | Freq: Two times a day (BID) | ORAL | Status: DC | PRN

## 2014-09-25 NOTE — Telephone Encounter (Signed)
Records release faxed to [facility] for 09/22/14 visit

## 2014-09-25 NOTE — Patient Instructions (Signed)
I suspect your chest pain is related to stress.   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 Visit.   Call/return sooner if needed   Counseling services   [facility] [phone] office [email or web address] [address] [place], [place], [place] [address]  Gale JourneyLaura Atkinson, therapist  Franchot ErichsenErik Nelson, MA, clinical psychologist  Cognitive-Behavior Therapy; Mood Disorders; Anxiety Disorders; adult and child ADHD; Family Therapy; Stress Management; personal growth, and Marital Therapy.    Carlus Pavlovennis McKnight Ph.D., clinical psychologist Cognitive-Behavior Therapy; Mood Disorders; Anxiety Disorders; Stress     Management   [facility] [address], [place], [place] [address] [phone]   [facility] Sheran LuzDesiree Wilkinson, psychotherapist [address] [place], [place] [address] [phone]   Miguel AschoffElaine Talbert Ph.D., clinical psychologist [phone] office [address] [place], [place] [address] Cognitive Behavior Therapy, Depression, Bipolar, Anxiety, Grief and Loss

## 2014-09-25 NOTE — Progress Notes (Signed)
Subjective: Here for hospital f/u.  Accompanied by husband.  Went to the [facility] ED this Tuesday night 4 days ago for pinching pain in chest and back left shoulder.  Had blood tests, EKG, CXR, and reportedly things were normal.   They weren't sure about the cause.   Had headache too.   By the time she left hospital the headache and back pain resolved, but still had chest pain the last few days.    Today feels pains in left upper chest.  The pain is intermittent, no particular pattern.  No assoiciated SOB, nausea, sweats, paresthesias.  Not worse with activity or eating.  Worse sitting still.  Has had similar episodes since last visit here.   Eating healthy, cut out most caffeine, cut out sodas.    Works as Bankerteacher and coach, 6th grade.  Has 3 children, 25576 years old, 28 years old, 28 years old.  Coaches swim team, special ed teacher. She started the year requesting 6 grade language arts but instead got a different grade, 4 very difficult special needs kids in her class, and a lot of other added responsibilities this year that she wasn't expecting. Feels overwhelmed, and despite requesting assistance or symptoms type of solution she feels like the school administration has ignored her.  Her husband also says that she works many hours pre-negative things for class sometimes seemingly getting nowhere.  She and her husband both say that they work together on household responsibilities, home life is good.    No recent illness.  Not pregnant currently.  Denies palpitations, leg swelling, acid reflux, abdominal pain, back pain, paresthesias.  Review of systems in subjective otherwise   Objective: BP 100/80 mmHg  Pulse 73  Temp(Src) 98.1 F (36.7 C) (Oral)  Resp 16  Wt 140 lb (63.504 kg)  General appearance: alert, no distress, WD/WN Oral cavity: MMM, no lesions Neck: supple, no lymphadenopathy, no thyromegaly, no masses, no JVD Heart: RRR, normal S1, S2, no murmurs Lungs: CTA bilaterally, no  wheezes, rhonchi, or rales Abdomen: +bs, soft, non tender, non distended, no masses, no hepatomegaly, no splenomegaly Pulses: 2+ symmetric, upper and lower extremities, normal cap refill No chest wall tenderness Back and arms nontender, arms normal ROM without pain Ext: no edema   Assessment: Encounter Diagnoses  Name Primary?  . Chest pain, unspecified chest pain type Yes  . Stress at work   . Atypical chest pain     Plan: We will request records from the recent [facility] emergency department visit. Of note although I saw her for very similar issue may add 2014 also from a hospital follow-up. Given the symptoms and concerns which sound atypical for cardiac source, and given no acid reflux symptoms, this is likely anxiety and stress related.  Can't completely rule out arrhythmia but low risk for this.  She has significant work-related stress, and advised she work on stress reduction, seek counseling, I gave her a list of counselors in the area that I recommend, advise she work with supervisor and fellow employees to try and to deal with the stressors a more productive way.  Can use Xanax when necessary for panic attack or chest pain, discussed risk and benefits of medication.  We will call once we receive the ED report, and as long as no worse she will return in 2 weeks.  return otherwise when necessary

## 2014-09-28 ENCOUNTER — Telehealth: Payer: Self-pay | Admitting: Medical

## 2014-09-28 NOTE — Telephone Encounter (Signed)
LMOM TO CB. CLS 

## 2014-09-28 NOTE — Telephone Encounter (Signed)
I reviewed the ED report.   See if she used the Xanax over the weekend?  Did it help?  I still feel that her symptom are likely stress related.  Have her do the things we discussed.  Plan f/u in 2-3 wk.

## 2014-09-29 NOTE — Telephone Encounter (Signed)
Patient did not try the Xanax this weekend and she will follow up here in a few weeks. Patient is aware  Of Kristian CoveyShane Tysinger PA message

## 2014-10-06 ENCOUNTER — Encounter: Payer: Self-pay | Admitting: Medical

## 2014-11-06 ENCOUNTER — Ambulatory Visit (INDEPENDENT_AMBULATORY_CARE_PROVIDER_SITE_OTHER): Payer: BC Managed Care – PPO | Admitting: Family Medicine

## 2014-11-06 ENCOUNTER — Encounter: Payer: Self-pay | Admitting: Family Medicine

## 2014-11-06 VITALS — BP 112/76 | HR 100 | Wt 139.0 lb

## 2014-11-06 DIAGNOSIS — R109 Unspecified abdominal pain: Secondary | ICD-10-CM

## 2014-11-06 NOTE — Progress Notes (Signed)
   Subjective:    Patient ID: Jodi Sanchez, female    DOB: 07/08/1986, 29 y.o.   MRN: [id number]  HPI She complains of a one-day history of vague abdominal pain that is now localized to the right mid quadrant area associated with slight bloating and nausea but no vomiting or diarrhea. She has eaten 2 meals with no effect on this. She has not had any greasy foods. She does note difficulty with constipation especially in the last several months having stools every 2 days and having to strain slightly.   Review of Systems     Objective:   Physical Exam Alert and in no distress. Normal bowel sounds. Negative Murphy's sign and Murphy's punch. No hepatosplenomegaly. No rebound tenderness.       Assessment & Plan:  Abdominal pain, unspecified abdominal location Fluids, bulk in your diet and exercise will help keep you regular. Pay attention to see if food makes any difference especially greasy foods I explained that I did not think this was appendicitis. She will return if further trouble.

## 2014-11-06 NOTE — Patient Instructions (Signed)
Fluids, bulk in your diet and exercise will help keep you regular. Pay attention to see if food makes any difference especially greasy foods

## 2014-11-09 ENCOUNTER — Ambulatory Visit (INDEPENDENT_AMBULATORY_CARE_PROVIDER_SITE_OTHER): Payer: BC Managed Care – PPO | Admitting: Medical

## 2014-11-09 ENCOUNTER — Encounter: Payer: Self-pay | Admitting: Medical

## 2014-11-09 VITALS — BP 100/70 | HR 86 | Temp 98.2°F | Resp 16 | Wt 140.0 lb

## 2014-11-09 DIAGNOSIS — R312 Other microscopic hematuria: Secondary | ICD-10-CM

## 2014-11-09 DIAGNOSIS — Z9851 Tubal ligation status: Secondary | ICD-10-CM

## 2014-11-09 DIAGNOSIS — R3129 Other microscopic hematuria: Secondary | ICD-10-CM

## 2014-11-09 DIAGNOSIS — K5900 Constipation, unspecified: Secondary | ICD-10-CM

## 2014-11-09 DIAGNOSIS — R1030 Lower abdominal pain, unspecified: Secondary | ICD-10-CM

## 2014-11-09 DIAGNOSIS — R110 Nausea: Secondary | ICD-10-CM

## 2014-11-09 LAB — CBC
HCT: 36.7 % (ref 36.0–46.0)
HEMOGLOBIN: 12.2 g/dL (ref 12.0–15.0)
MCH: 29 pg (ref 26.0–34.0)
MCHC: 33.2 g/dL (ref 30.0–36.0)
MCV: 87.2 fL (ref 78.0–100.0)
MPV: 9.6 fL (ref 8.6–12.4)
PLATELETS: 253 10*3/uL (ref 150–400)
RBC: 4.21 MIL/uL (ref 3.87–5.11)
RDW: 12.9 % (ref 11.5–15.5)
WBC: 9 10*3/uL (ref 4.0–10.5)

## 2014-11-09 LAB — POCT URINALYSIS DIPSTICK
Bilirubin, UA: NEGATIVE
GLUCOSE UA: NEGATIVE
KETONES UA: NEGATIVE
NITRITE UA: NEGATIVE
Protein, UA: NEGATIVE
SPEC GRAV UA: 1.015
UROBILINOGEN UA: NEGATIVE
pH, UA: 6

## 2014-11-09 LAB — BASIC METABOLIC PANEL
BUN: 7 mg/dL (ref 6–23)
CALCIUM: 9.1 mg/dL (ref 8.4–10.5)
CO2: 22 mEq/L (ref 19–32)
Chloride: 103 mEq/L (ref 96–112)
Creat: 0.8 mg/dL (ref 0.50–1.10)
Glucose, Bld: 83 mg/dL (ref 70–99)
POTASSIUM: 3.8 meq/L (ref 3.5–5.3)
Sodium: 137 mEq/L (ref 135–145)

## 2014-11-09 MED ORDER — ONDANSETRON HCL 4 MG PO TABS: 4.0000 mg | ORAL_TABLET | Freq: Three times a day (TID) | ORAL | Status: DC | PRN

## 2014-11-09 NOTE — Addendum Note (Signed)
Addended by: Janeice RobinsonSCALES, Dexter Sauser L on: 11/09/2014 10:32 AM   Modules accepted: Orders
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
## 2014-11-11 ENCOUNTER — Other Ambulatory Visit: Payer: Self-pay | Admitting: Medical

## 2014-11-11 MED ORDER — LINACLOTIDE 145 MCG PO CAPS: 145.0000 ug | ORAL_CAPSULE | Freq: Every day | ORAL | Status: DC

## 2014-11-11 MED ORDER — CIPROFLOXACIN HCL 500 MG PO TABS: 500.0000 mg | ORAL_TABLET | Freq: Two times a day (BID) | ORAL | Status: DC

## 2014-11-12 LAB — URINE CULTURE: Colony Count: >=100000

## 2015-07-18 ENCOUNTER — Emergency Department
Admission: EM | Admit: 2015-07-18 | Discharge: 2015-07-18 | Disposition: A | Discharge status: Home / Self Care | Payer: BLUE CROSS/BLUE SHIELD | Attending: Emergency Medicine | Admitting: Emergency Medicine

## 2015-07-18 ENCOUNTER — Emergency Department: Payer: BLUE CROSS/BLUE SHIELD

## 2015-07-18 DIAGNOSIS — Z3202 Encounter for pregnancy test, result negative: Secondary | ICD-10-CM | POA: Diagnosis not present

## 2015-07-18 DIAGNOSIS — Z79899 Other long term (current) drug therapy: Secondary | ICD-10-CM | POA: Insufficient documentation

## 2015-07-18 DIAGNOSIS — N83202 Unspecified ovarian cyst, left side: Secondary | ICD-10-CM | POA: Insufficient documentation

## 2015-07-18 DIAGNOSIS — N83209 Unspecified ovarian cyst, unspecified side: Secondary | ICD-10-CM

## 2015-07-18 DIAGNOSIS — Z792 Long term (current) use of antibiotics: Secondary | ICD-10-CM | POA: Diagnosis not present

## 2015-07-18 DIAGNOSIS — R1030 Lower abdominal pain, unspecified: Secondary | ICD-10-CM | POA: Diagnosis present

## 2015-07-18 LAB — COMPREHENSIVE METABOLIC PANEL
ALBUMIN: 4 g/dL (ref 3.5–5.0)
ALT: 13 U/L — ABNORMAL LOW (ref 14–54)
ANION GAP: 7 (ref 5–15)
AST: 23 U/L (ref 15–41)
Alkaline Phosphatase: 46 U/L (ref 38–126)
BILIRUBIN TOTAL: 0.6 mg/dL (ref 0.3–1.2)
BUN: 9 mg/dL (ref 6–20)
CHLORIDE: 106 mmol/L (ref 101–111)
CO2: 24 mmol/L (ref 22–32)
Calcium: 8.9 mg/dL (ref 8.9–10.3)
Creatinine, Ser: 0.79 mg/dL (ref 0.44–1.00)
GFR calc Af Amer: >60 mL/min (ref >60)
GFR calc non Af Amer: >60 mL/min (ref >60)
GLUCOSE: 119 mg/dL — AB (ref 65–99)
POTASSIUM: 3.8 mmol/L (ref 3.5–5.1)
Sodium: 137 mmol/L (ref 135–145)
TOTAL PROTEIN: 7.3 g/dL (ref 6.5–8.1)

## 2015-07-18 LAB — CBC
HCT: 37.2 % (ref 35.0–47.0)
Hemoglobin: 12.7 g/dL (ref 12.0–16.0)
MCH: 29.6 pg (ref 26.0–34.0)
MCHC: 34 g/dL (ref 32.0–36.0)
MCV: 87 fL (ref 80.0–100.0)
PLATELETS: 242 10*3/uL (ref 150–440)
RBC: 4.28 MIL/uL (ref 3.80–5.20)
RDW: 12.3 % (ref 11.5–14.5)
WBC: 10.6 10*3/uL (ref 3.6–11.0)

## 2015-07-18 LAB — HCG, QUANTITATIVE, PREGNANCY: hCG, Beta Chain, Quant, S: <1 m[IU]/mL (ref <5)

## 2015-07-18 LAB — LIPASE, BLOOD: LIPASE: 23 U/L (ref 11–51)

## 2015-07-18 MED ORDER — OXYCODONE-ACETAMINOPHEN 5-325 MG PO TABS
2.0000 | ORAL_TABLET | Freq: Once | ORAL | Status: AC
  Administered 2015-07-18: 2 via ORAL
  Filled 2015-07-18: qty 2

## 2015-07-18 MED ORDER — ONDANSETRON HCL 4 MG/2ML IJ SOLN
4.0000 mg | Freq: Once | INTRAMUSCULAR | Status: AC | PRN
  Administered 2015-07-18: 4 mg via INTRAVENOUS
  Filled 2015-07-18: qty 2

## 2015-07-18 MED ORDER — OXYCODONE-ACETAMINOPHEN 5-325 MG PO TABS: 2.0000 | ORAL_TABLET | Freq: Four times a day (QID) | ORAL | Status: DC | PRN

## 2015-07-18 MED ORDER — IOHEXOL 300 MG/ML  SOLN
100.0000 mL | Freq: Once | INTRAMUSCULAR | Status: AC | PRN
  Administered 2015-07-18: 100 mL via INTRAVENOUS

## 2015-07-18 MED ORDER — IOHEXOL 240 MG/ML SOLN
25.0000 mL | Freq: Once | INTRAMUSCULAR | Status: AC | PRN
  Administered 2015-07-18: 25 mL via ORAL
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
## 2015-07-18 NOTE — ED Notes (Signed)
Pt presents w/ lower abdominal pain. Pt sts she woke up at 0530 with a feeling of gas trapped in her stomach. Pt has been feeling dizzy and has been vomiting since 0600 am this morning. Pt denies burning while urinating but sts it is uncomfortable. Pt took gas-x at 0900. Pt sts she tried eating toast around 0900 and could not keep it down.

## 2015-07-18 NOTE — ED Provider Notes (Signed)
IMPRESSION: Moderate pelvic hemorrhage with associated abdominopelvic ascites, as described above.  In the setting of a negative pregnancy test, this likely reflects a ruptured (left) hemorrhagic ovarian cyst. Consider pelvic ultrasound with Doppler for further evaluation as clinically warranted.  I have discussed the case with Dr. Annamarie MajorPaul [facility] who recommends outpatient follow-up pain medication. Her vital signs here are normal and stable. She does not have severe pain. She'll be discharged with pain medication and again follow up in 1-2 days for recheck  Jodi FilbertJonathan E Williams, MD 07/18/15 [phone]

## 2015-07-18 NOTE — Discharge Instructions (Signed)
Ovarian Cyst An ovarian cyst is a fluid-filled sac that forms on an ovary. The ovaries are small organs that produce eggs in women. Various types of cysts can form on the ovaries. Most are not cancerous. Many do not cause problems, and they often go away on their own. Some may cause symptoms and require treatment. Common types of ovarian cysts include:  Functional cysts--These cysts may occur every month during the menstrual cycle. This is normal. The cysts usually go away with the next menstrual cycle if the woman does not get pregnant. Usually, there are no symptoms with a functional cyst.  Endometrioma cysts--These cysts form from the tissue that lines the uterus. They are also called "chocolate cysts" because they become filled with blood that turns brown. This type of cyst can cause pain in the lower abdomen during intercourse and with your menstrual period.  Cystadenoma cysts--This type develops from the cells on the outside of the ovary. These cysts can get very big and cause lower abdomen pain and pain with intercourse. This type of cyst can twist on itself, cut off its blood supply, and cause severe pain. It can also easily rupture and cause a lot of pain.  Dermoid cysts--This type of cyst is sometimes found in both ovaries. These cysts may contain different kinds of body tissue, such as skin, teeth, hair, or cartilage. They usually do not cause symptoms unless they get very big.  Theca lutein cysts--These cysts occur when too much of a certain hormone (human chorionic gonadotropin) is produced and overstimulates the ovaries to produce an egg. This is most common after procedures used to assist with the conception of a baby (in vitro fertilization). CAUSES   Fertility drugs can cause a condition in which multiple large cysts are formed on the ovaries. This is called ovarian hyperstimulation syndrome.  A condition called polycystic ovary syndrome can cause hormonal imbalances that can lead to  nonfunctional ovarian cysts. SIGNS AND SYMPTOMS  Many ovarian cysts do not cause symptoms. If symptoms are present, they may include:  Pelvic pain or pressure.  Pain in the lower abdomen.  Pain during sexual intercourse.  Increasing girth (swelling) of the abdomen.  Abnormal menstrual periods.  Increasing pain with menstrual periods.  Stopping having menstrual periods without being pregnant. DIAGNOSIS  These cysts are commonly found during a routine or annual pelvic exam. Tests may be ordered to find out more about the cyst. These tests may include:  Ultrasound.  X-ray of the pelvis.  CT scan.  MRI.  Blood tests. TREATMENT  Many ovarian cysts go away on their own without treatment. Your health care provider may want to check your cyst regularly for 2-3 months to see if it changes. For women in menopause, it is particularly important to monitor a cyst closely because of the higher rate of ovarian cancer in menopausal women. When treatment is needed, it may include any of the following:  A procedure to drain the cyst (aspiration). This may be done using a long needle and ultrasound. It can also be done through a laparoscopic procedure. This involves using a thin, lighted tube with a tiny camera on the end (laparoscope) inserted through a small incision.  Surgery to remove the whole cyst. This may be done using laparoscopic surgery or an open surgery involving a larger incision in the lower abdomen.  Hormone treatment or birth control pills. These methods are sometimes used to help dissolve a cyst. HOME CARE INSTRUCTIONS   Only take over-the-counter   or prescription medicines as directed by your health care provider.  Follow up with your health care provider as directed.  Get regular pelvic exams and Pap tests. SEEK MEDICAL CARE IF:   Your periods are late, irregular, or painful, or they stop.  Your pelvic pain or abdominal pain does not go away.  Your abdomen becomes  larger or swollen.  You have pressure on your bladder or trouble emptying your bladder completely.  You have pain during sexual intercourse.  You have feelings of fullness, pressure, or discomfort in your stomach.  You lose weight for no apparent reason.  You feel generally ill.  You become constipated.  You lose your appetite.  You develop acne.  You have an increase in body and facial hair.  You are gaining weight, without changing your exercise and eating habits.  You think you are pregnant. SEEK IMMEDIATE MEDICAL CARE IF:   You have increasing abdominal pain.  You feel sick to your stomach (nauseous), and you throw up (vomit).  You develop a fever that comes on suddenly.  You have abdominal pain during a bowel movement.  Your menstrual periods become heavier than usual. MAKE SURE YOU:  Understand these instructions.  Will watch your condition.  Will get help right away if you are not doing well or get worse.   This information is not intended to replace advice given to you by your health care provider. Make sure you discuss any questions you have with your health care provider.   Document Released: 09/04/2005 Document Revised: 09/09/2013 Document Reviewed: 05/12/2013 Elsevier Interactive Patient Education 2016 [employer]  

## 2015-07-18 NOTE — ED Notes (Signed)
Presents with abd pain. With some nausea/vomiting

## 2015-07-19 ENCOUNTER — Encounter: Payer: Self-pay | Admitting: Family Medicine

## 2015-07-19 ENCOUNTER — Ambulatory Visit (INDEPENDENT_AMBULATORY_CARE_PROVIDER_SITE_OTHER): Payer: BLUE CROSS/BLUE SHIELD | Admitting: Family Medicine

## 2015-07-19 VITALS — BP 120/70 | HR 68 | Wt 137.4 lb

## 2015-07-19 DIAGNOSIS — R1031 Right lower quadrant pain: Secondary | ICD-10-CM | POA: Diagnosis not present

## 2015-07-19 MED ORDER — ONDANSETRON HCL 4 MG PO TABS: 4.0000 mg | ORAL_TABLET | Freq: Three times a day (TID) | ORAL | Status: DC | PRN

## 2015-07-19 NOTE — Progress Notes (Signed)
   Subjective:    Patient ID: Jodi Sanchez, female    DOB: 05/08/1986, 29 y.o.   MRN: [id number]  HPI She is here with complaints of right lower abdominal pain since yesterday. States the pain is a constant dull ache and yesterday was more severe. Reports some improvement since onset of pain.  Also complains of intermittent nausea without vomiting in past 24 hours. Denies vaginal discharge, irritation, GI or urinary issues. Denies fever, chills, back pain. She has history of tubal ligation.  She went to the ED last evening and states she had a cat scan and was told she had a ruptured ovarian cyst. States she was told to follow up in 1-2 days with PCP or GYN. She is taking oxycodone for pain which is helping but she states she cannot function on the medication.  Negative pregnancy test in ED, normal WBC.   Reviewed allergies, medications, past medical and surgical and social history.    Review of Systems Pertinent positives and negatives in the history of present illness.    Objective:   Physical Exam  Constitutional: She is oriented to person, place, and time. She appears well-developed and well-nourished. No distress.  Cardiovascular: Normal rate, regular rhythm and normal heart sounds.   Pulmonary/Chest: Effort normal and breath sounds normal.  Abdominal: Soft. Bowel sounds are normal. She exhibits no distension. There is no hepatosplenomegaly. There is tenderness in the right lower quadrant. There is no rebound, no guarding, no CVA tenderness and negative Murphy's sign.    Neurological: She is alert and oriented to person, place, and time.       Assessment & Plan:  Right lower quadrant pain  Reviewed findings from her ED visit with her including labs and CT result. Zofran prescribed for nausea. She has Oxycodone for pain, discussed that I expect that she should be able to wean herself off this and switch to Ibuprofen for pain control. She has a call in to her Gynecologist for  follow up as well. Discussed that if she is not continuing to improve that we can order the pelvic ultrasound as recommended by radiologist or if she is going to her GYN, that is fine also. She will let us know. Discussed red flags such as fever, chills, uncontrolled vomiting or worsening abdominal pain.  Discussed patient and plan of care with Dr. Susann GivensLalonde and in agreement.

## 2015-08-30 ENCOUNTER — Other Ambulatory Visit: Payer: Self-pay | Admitting: Obstetrics and Gynecology

## 2015-08-31 LAB — CYTOLOGY - PAP

## 2016-09-28 ENCOUNTER — Ambulatory Visit (INDEPENDENT_AMBULATORY_CARE_PROVIDER_SITE_OTHER): Payer: BLUE CROSS/BLUE SHIELD | Admitting: Family Medicine

## 2016-09-28 ENCOUNTER — Encounter: Payer: Self-pay | Admitting: Family Medicine

## 2016-09-28 VITALS — BP 102/70 | HR 69 | Wt 135.0 lb

## 2016-09-28 DIAGNOSIS — H00014 Hordeolum externum left upper eyelid: Secondary | ICD-10-CM

## 2016-09-28 MED ORDER — AMOXICILLIN 875 MG PO TABS: 875.0000 mg | ORAL_TABLET | Freq: Two times a day (BID) | ORAL | 0 refills | Status: DC

## 2016-09-28 NOTE — Progress Notes (Signed)
   Subjective:    Patient ID: Jodi Sanchez, female    DOB: 04/22/1986, 31 y.o.   MRN: [id number]  HPI She has a several day history of swelling and redness to the left upper eyelid.   Review of Systems     Objective:   Physical Exam The left upper eyelid is red and swollen and slightly tender. Cornea and anterior chamber normal       Assessment & Plan:  Hordeolum externum of left upper eyelid - Plan: amoxicillin (AMOXIL) 875 MG tablet, Ambulatory referral to Ophthalmology Recommend heat to the area and then refer to ophthalmology.

## 2016-11-19 ENCOUNTER — Emergency Department
Admission: EM | Admit: 2016-11-19 | Discharge: 2016-11-19 | Disposition: A | Discharge status: Home / Self Care | Payer: BLUE CROSS/BLUE SHIELD | Attending: Emergency Medicine | Admitting: Emergency Medicine

## 2016-11-19 ENCOUNTER — Encounter: Payer: Self-pay | Admitting: Emergency Medicine

## 2016-11-19 DIAGNOSIS — K2900 Acute gastritis without bleeding: Secondary | ICD-10-CM | POA: Diagnosis not present

## 2016-11-19 DIAGNOSIS — R1010 Upper abdominal pain, unspecified: Secondary | ICD-10-CM

## 2016-11-19 DIAGNOSIS — R1013 Epigastric pain: Secondary | ICD-10-CM | POA: Diagnosis present

## 2016-11-19 DIAGNOSIS — E876 Hypokalemia: Secondary | ICD-10-CM | POA: Insufficient documentation

## 2016-11-19 DIAGNOSIS — R110 Nausea: Secondary | ICD-10-CM

## 2016-11-19 LAB — URINALYSIS, COMPLETE (UACMP) WITH MICROSCOPIC
Bilirubin Urine: NEGATIVE
GLUCOSE, UA: NEGATIVE mg/dL
HGB URINE DIPSTICK: NEGATIVE
KETONES UR: 80 mg/dL — AB
Leukocytes, UA: NEGATIVE
NITRITE: NEGATIVE
PH: 6 (ref 5.0–8.0)
PROTEIN: NEGATIVE mg/dL
Specific Gravity, Urine: 1.024 (ref 1.005–1.030)

## 2016-11-19 LAB — COMPREHENSIVE METABOLIC PANEL
ALT: 13 U/L — AB (ref 14–54)
ANION GAP: 7 (ref 5–15)
AST: 24 U/L (ref 15–41)
Albumin: 3.9 g/dL (ref 3.5–5.0)
Alkaline Phosphatase: 49 U/L (ref 38–126)
BILIRUBIN TOTAL: 0.9 mg/dL (ref 0.3–1.2)
BUN: 9 mg/dL (ref 6–20)
CALCIUM: 8.4 mg/dL — AB (ref 8.9–10.3)
CO2: 24 mmol/L (ref 22–32)
CREATININE: 0.79 mg/dL (ref 0.44–1.00)
Chloride: 104 mmol/L (ref 101–111)
Glucose, Bld: 97 mg/dL (ref 65–99)
Potassium: 3.3 mmol/L — ABNORMAL LOW (ref 3.5–5.1)
Sodium: 135 mmol/L (ref 135–145)
TOTAL PROTEIN: 7.7 g/dL (ref 6.5–8.1)

## 2016-11-19 LAB — CBC
HCT: 38.6 % (ref 35.0–47.0)
HEMOGLOBIN: 13.3 g/dL (ref 12.0–16.0)
MCH: 29.5 pg (ref 26.0–34.0)
MCHC: 34.4 g/dL (ref 32.0–36.0)
MCV: 85.6 fL (ref 80.0–100.0)
PLATELETS: 251 10*3/uL (ref 150–440)
RBC: 4.51 MIL/uL (ref 3.80–5.20)
RDW: 12.3 % (ref 11.5–14.5)
WBC: 6.1 10*3/uL (ref 3.6–11.0)

## 2016-11-19 LAB — LIPASE, BLOOD: Lipase: 11 U/L (ref 11–51)

## 2016-11-19 MED ORDER — SODIUM CHLORIDE 0.9 % IV BOLUS (SEPSIS)
1000.0000 mL | Freq: Once | INTRAVENOUS | Status: AC
  Administered 2016-11-19: 1000 mL via INTRAVENOUS

## 2016-11-19 MED ORDER — POTASSIUM CHLORIDE CRYS ER 20 MEQ PO TBCR
40.0000 meq | EXTENDED_RELEASE_TABLET | Freq: Once | ORAL | Status: AC
  Administered 2016-11-19: 40 meq via ORAL
  Filled 2016-11-19: qty 2

## 2016-11-19 MED ORDER — PROMETHAZINE HCL 12.5 MG PO TABS: 25.0000 mg | ORAL_TABLET | Freq: Four times a day (QID) | ORAL | 0 refills | Status: DC | PRN

## 2016-11-19 MED ORDER — ONDANSETRON HCL 4 MG/2ML IJ SOLN
4.0000 mg | Freq: Once | INTRAMUSCULAR | Status: AC
  Administered 2016-11-19: 4 mg via INTRAVENOUS
  Filled 2016-11-19: qty 2

## 2016-11-19 MED ORDER — KETOROLAC TROMETHAMINE 30 MG/ML IJ SOLN
30.0000 mg | Freq: Once | INTRAMUSCULAR | Status: AC
  Administered 2016-11-19: 30 mg via INTRAVENOUS
  Filled 2016-11-19: qty 1
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
## 2016-11-19 NOTE — Discharge Instructions (Signed)
You were evaluated for upper abdominal discomfort and nausea and vomiting, and as we discussed no certain cause was found, but your exam and evaluation are reassuring in the emergency department today.  As we discussed, you may be having some gastritis. Avoid spicy and acidic foods and drinks for about 7-10 days. He may try over-the-counter acid reducer such as Prilosec 20 mg once daily for 1-2 weeks.  For immediate symptom relief, you may try over-the-counter Tums or Maalox.  Return to the emergency room immediately for any worsening abdominal pain, vomiting blood, black or bloody stool, fever, concern for dehydration such as not making urine are dry mouth, chest pain, trouble breathing, dizziness or passing out, or any other symptoms concerning to you.

## 2016-11-19 NOTE — ED Notes (Signed)
Pt verbalized understanding of discharge instructions. NAD at this time. 

## 2016-11-19 NOTE — ED Triage Notes (Signed)
Pt reports epigastric pain that started yesterday around 6pm. Pt states she has vomited x2 last being this morning.

## 2016-11-21 LAB — URINE CULTURE: Culture: 70000 — AB

## 2016-11-22 NOTE — Progress Notes (Signed)
ED Antimicrobial Stewardship Positive Culture Follow Up   Jodi Sanchez is an 31 y.o. female who [facility] with a chief complaint of  Chief Complaint  Patient presents with  . Abdominal Pain    Recent Results (from the past 720 hour(s))  Urine culture     Status: Abnormal   Collection Time: 11/19/16  9:06 AM  Result Value Ref Range Status   Specimen Description URINE, RANDOM  Final   Special Requests NONE  Final   Culture 70,000 COLONIES/mL ESCHERICHIA COLI (A)  Final   Report Status 11/21/2016 FINAL  Final   Organism ID, Bacteria ESCHERICHIA COLI (A)  Final      Susceptibility   Escherichia coli - MIC*    AMPICILLIN >=32 RESISTANT Resistant     CEFAZOLIN <=4 SENSITIVE Sensitive     CEFTRIAXONE <=1 SENSITIVE Sensitive     CIPROFLOXACIN >=4 RESISTANT Resistant     GENTAMICIN <=1 SENSITIVE Sensitive     IMIPENEM <=0.25 SENSITIVE Sensitive     NITROFURANTOIN <=16 SENSITIVE Sensitive     TRIMETH/SULFA >=320 RESISTANT Resistant     AMPICILLIN/SULBACTAM >=32 RESISTANT Resistant     PIP/TAZO <=4 SENSITIVE Sensitive     Extended ESBL NEGATIVE Sensitive     * 70,000 COLONIES/mL ESCHERICHIA COLI    [x]  Patient discharged originally without antimicrobial agent and treatment is now indicated. Per discussion, MD would like to treat at this time Patient phone # [phone]  New antibiotic prescription: Cephalexin 500 mg po BID x 7 days  ED Provider: Paduchowski  1st Attempt 11/22/16- Called patient's phone # as above and left voicemail message.  Amorette Charrette A 11/22/2016, 6:11 PM

## 2017-10-03 IMAGING — CT CT ABD-PELV W/ CM
1 of 2 series · 15 of 32 positions shown, 19 images · IV contrast (omnipaque)
Comparison: None.

CLINICAL DATA: Right lower abdominal pain, dizziness, vomiting

EXAM:
CT ABDOMEN AND PELVIS WITH CONTRAST
TECHNIQUE: Multidetector CT imaging of the abdomen and pelvis was performed
using the standard protocol following bolus administration of
intravenous contrast.
CONTRAST:  100mL OMNIPAQUE IOHEXOL 300 MG/ML  SOLN

[Series 2: routine abd pel with · axial · 0.63mm/px · z∈[-336,+44]mm · 15 of 84 slices shown, 19 images]
[im 4/84  soft-tissue]
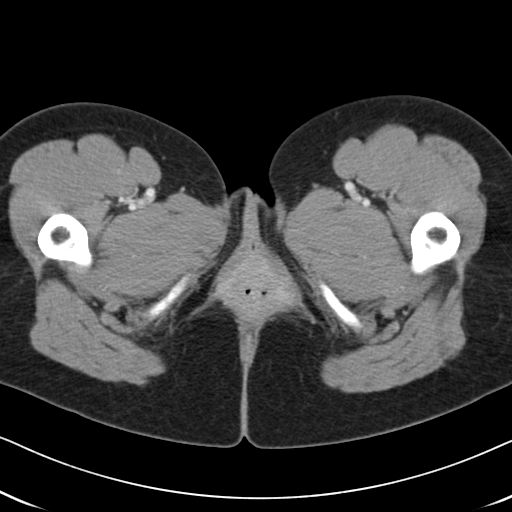
[im 4/84  bone]
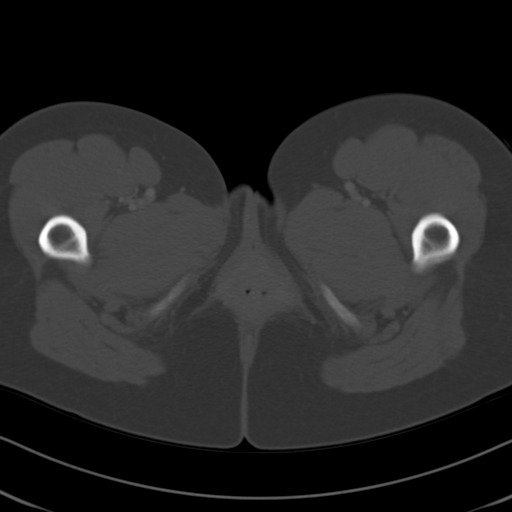
[im 10/84  soft-tissue]
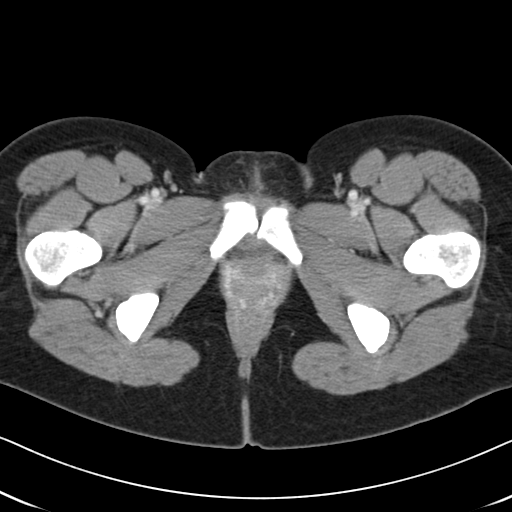
[im 16/84  soft-tissue]
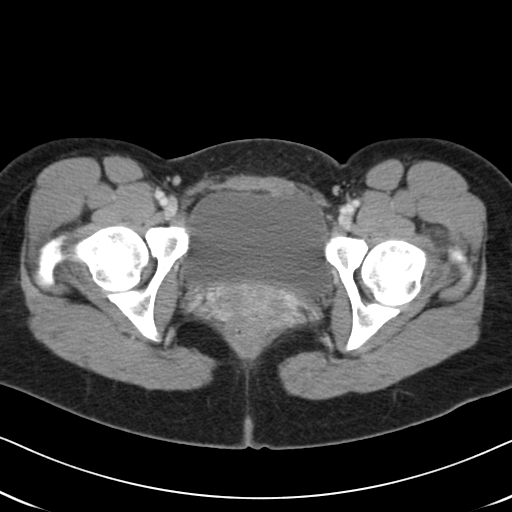
[im 23/84  soft-tissue]
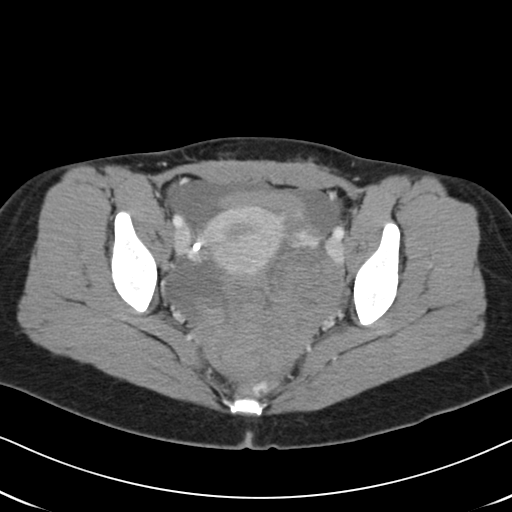
[im 29/84  soft-tissue]
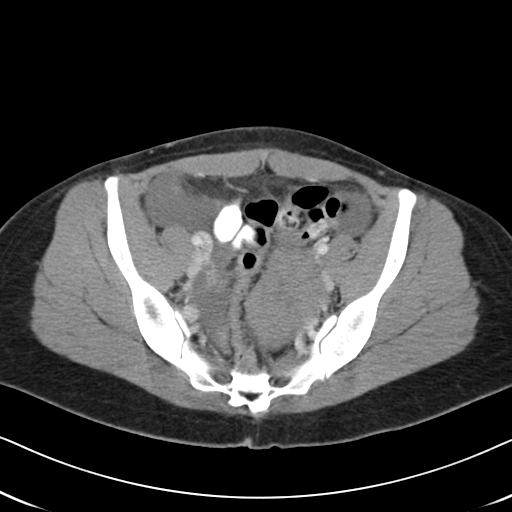
[im 36/84  soft-tissue]
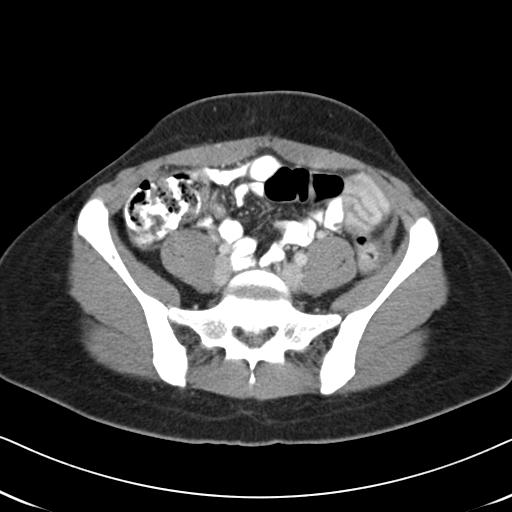
[im 42/84  soft-tissue]
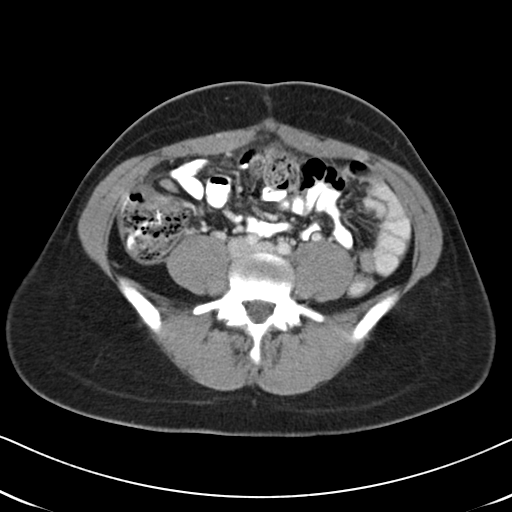
[im 48/84  soft-tissue]
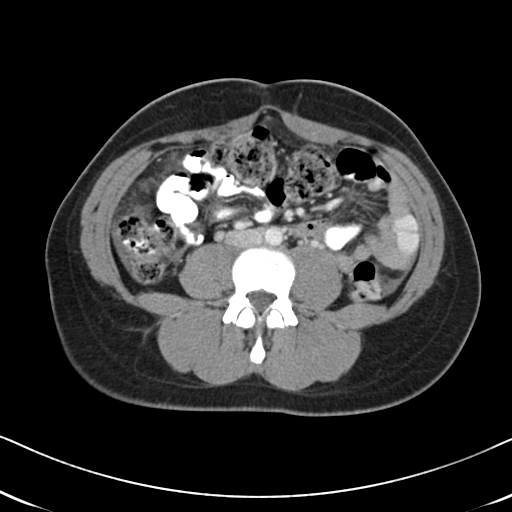
[im 55/84  soft-tissue]
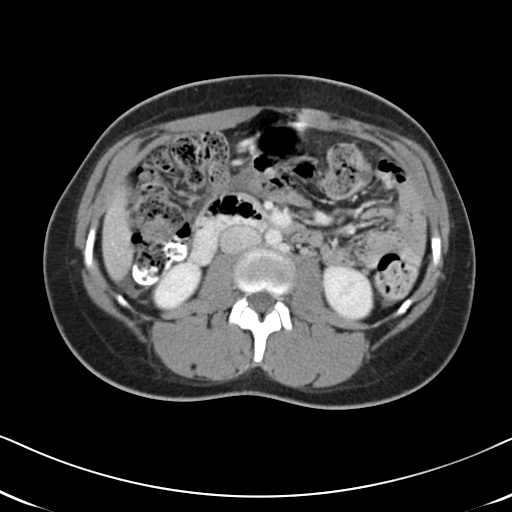
[im 55/84  bone]
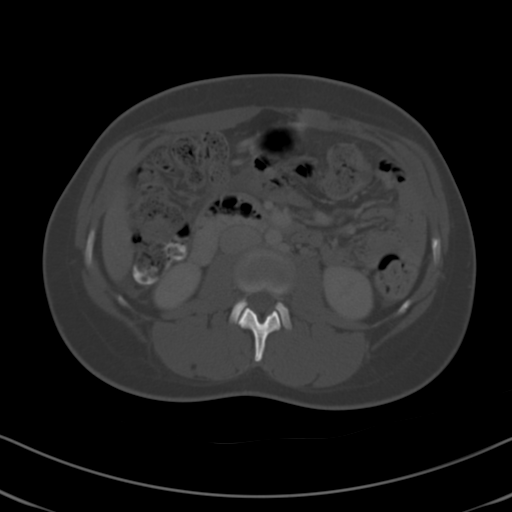
[im 61/84  soft-tissue]
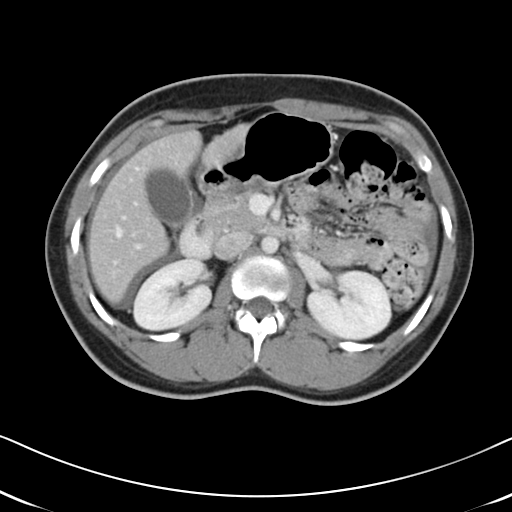
[im 68/84  soft-tissue]
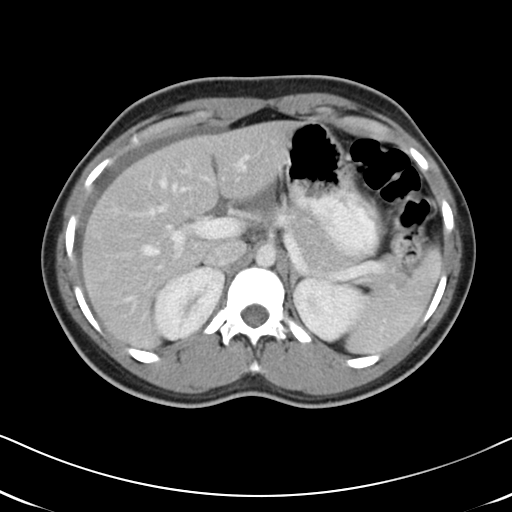
[im 71/84  lung]
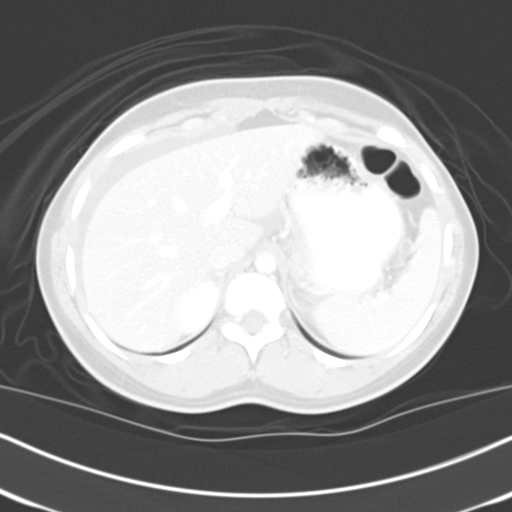
[im 74/84  soft-tissue]
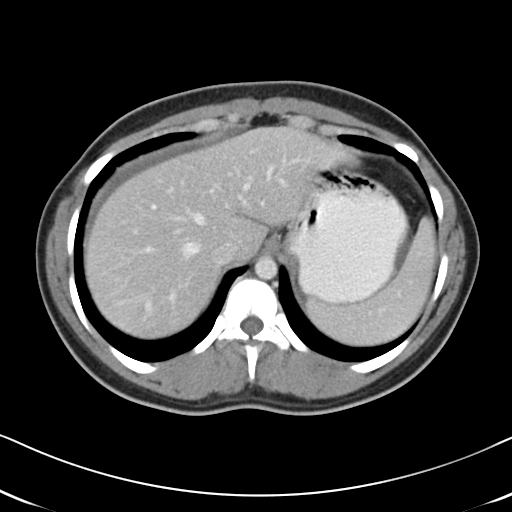
[im 74/84  lung]
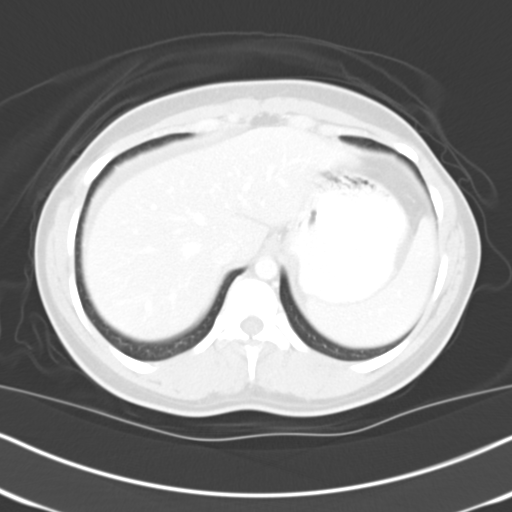
[im 77/84  lung]
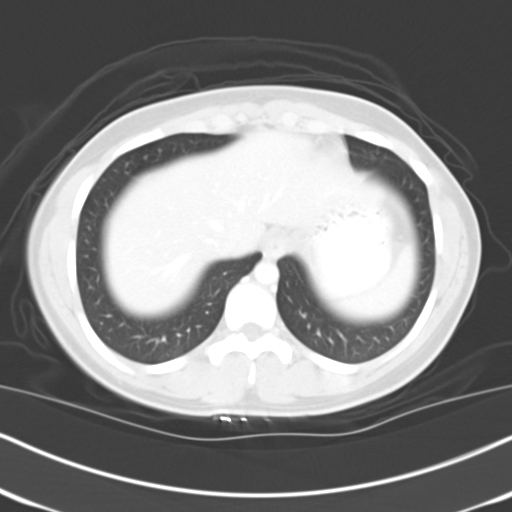
[im 80/84  soft-tissue]
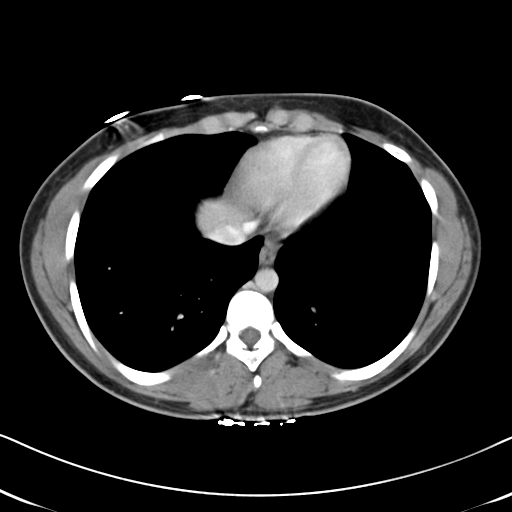
[im 80/84  lung]
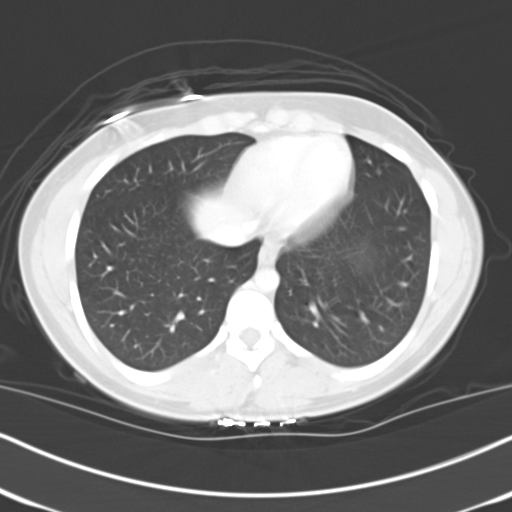

[15 of 32 positions shown; findings below may reference images not displayed]

FINDINGS: Lower chest:  Lung bases are clear.

Hepatobiliary: Liver is within normal limits. No
suspicious/enhancing hepatic lesions.

Gallbladder is unremarkable. No intrahepatic or extrahepatic ductal
dilatation.

Pancreas: Within normal limits.

Spleen: Within normal limits.

Adrenals/Urinary Tract: Adrenal glands are within normal limits.

Kidneys within normal limits.  No hydronephrosis.

Bladder is within normal limits.

Stomach/Bowel: Stomach is within normal limits.

No evidence of bowel obstruction.

Normal appendix (series 2/image 50).

Vascular/Lymphatic: No evidence of abdominal aortic aneurysm.

Retroaortic left renal vein.

No suspicious abdominopelvic lymphadenopathy.

Reproductive: Uterus is within normal limits.

Right ovary is unremarkable (series 2/image 59). Left ovary is
poorly visualized/obscured on CT. Bilateral tubal ligation clips.

Other: Moderate pelvic hemorrhage extending from the left adnexa and
layering in the pelvic cul-de-sac (series 2/ image 62).

Associated small to moderate abdominopelvic ascites, including
simple perihepatic and perisplenic ascites.

Musculoskeletal: Visualized osseous structures are within normal
limits.
IMPRESSION: Moderate pelvic hemorrhage with associated abdominopelvic ascites,
as described above.

In the setting of a negative pregnancy test, this likely reflects a
ruptured (left) hemorrhagic ovarian cyst. Consider pelvic ultrasound
with Doppler for further evaluation as clinically warranted.

## 2017-10-16 ENCOUNTER — Telehealth: Payer: Self-pay | Admitting: Family Medicine

## 2017-10-16 MED ORDER — OSELTAMIVIR PHOSPHATE 75 MG PO CAPS: 75.0000 mg | ORAL_CAPSULE | Freq: Two times a day (BID) | ORAL | 0 refills | Status: DC

## 2017-10-16 NOTE — Telephone Encounter (Signed)
Pt request script for Tamiflu. 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 She is starting to have symptoms now with runny nose, congestion, not feeling very well.

## 2018-04-18 DIAGNOSIS — R1031 Right lower quadrant pain: Secondary | ICD-10-CM | POA: Insufficient documentation

## 2018-04-18 DIAGNOSIS — Z5321 Procedure and treatment not carried out due to patient leaving prior to being seen by health care provider: Secondary | ICD-10-CM | POA: Diagnosis not present

## 2018-04-18 LAB — URINALYSIS, COMPLETE (UACMP) WITH MICROSCOPIC
BACTERIA UA: NONE SEEN
BILIRUBIN URINE: NEGATIVE
GLUCOSE, UA: NEGATIVE mg/dL
Ketones, ur: NEGATIVE mg/dL
LEUKOCYTES UA: NEGATIVE
NITRITE: NEGATIVE
Protein, ur: NEGATIVE mg/dL
SPECIFIC GRAVITY, URINE: 1.021 (ref 1.005–1.030)
pH: 5 (ref 5.0–8.0)

## 2018-04-18 LAB — CBC
HCT: 37.6 % (ref 35.0–47.0)
HEMOGLOBIN: 13 g/dL (ref 12.0–16.0)
MCH: 30.1 pg (ref 26.0–34.0)
MCHC: 34.5 g/dL (ref 32.0–36.0)
MCV: 87 fL (ref 80.0–100.0)
Platelets: 266 10*3/uL (ref 150–440)
RBC: 4.32 MIL/uL (ref 3.80–5.20)
RDW: 12.9 % (ref 11.5–14.5)
WBC: 7.8 10*3/uL (ref 3.6–11.0)

## 2018-04-18 LAB — COMPREHENSIVE METABOLIC PANEL
ALT: 15 U/L (ref 0–44)
ANION GAP: 7 (ref 5–15)
AST: 29 U/L (ref 15–41)
Albumin: 3.9 g/dL (ref 3.5–5.0)
Alkaline Phosphatase: 45 U/L (ref 38–126)
BILIRUBIN TOTAL: 0.5 mg/dL (ref 0.3–1.2)
BUN: 13 mg/dL (ref 6–20)
CO2: 26 mmol/L (ref 22–32)
Calcium: 8.9 mg/dL (ref 8.9–10.3)
Chloride: 105 mmol/L (ref 98–111)
Creatinine, Ser: 0.9 mg/dL (ref 0.44–1.00)
Glucose, Bld: 96 mg/dL (ref 70–99)
POTASSIUM: 3.7 mmol/L (ref 3.5–5.1)
Sodium: 138 mmol/L (ref 135–145)
TOTAL PROTEIN: 7.2 g/dL (ref 6.5–8.1)

## 2018-04-18 LAB — LIPASE, BLOOD: Lipase: 34 U/L (ref 11–51)

## 2018-04-18 LAB — POCT PREGNANCY, URINE: PREG TEST UR: NEGATIVE

## 2018-04-18 NOTE — ED Triage Notes (Signed)
Patient c/o intermittent right lower abdominal pain beginning earlier today. Patient denies, N/V/D and urinary changes. Patient describes pain and pinch or throb.

## 2018-04-19 ENCOUNTER — Emergency Department
Admission: EM | Admit: 2018-04-19 | Discharge: 2018-04-19 | Disposition: A | Discharge status: Home / Self Care | Payer: BLUE CROSS/BLUE SHIELD | Attending: Emergency Medicine | Admitting: Emergency Medicine

## 2018-04-19 NOTE — ED Notes (Signed)
Pt up to STAT desk stating they are leaving at this time. Pt informed she is the next pt up for a bed, but states she "feels better and would rather just go home". Pt encouraged to remain for EDP evaluation but family insists on leaving. Pt informed of return precautions, and instructed to return to ED if s/x's fail to improve or worsen.

## 2018-06-03 ENCOUNTER — Encounter: Payer: BLUE CROSS/BLUE SHIELD | Admitting: Medical

## 2018-06-05 ENCOUNTER — Encounter: Payer: BLUE CROSS/BLUE SHIELD | Admitting: Medical

## 2018-06-28 ENCOUNTER — Encounter: Payer: Self-pay | Admitting: Medical

## 2018-06-28 ENCOUNTER — Ambulatory Visit (INDEPENDENT_AMBULATORY_CARE_PROVIDER_SITE_OTHER): Payer: BC Managed Care – PPO | Admitting: Medical

## 2018-06-28 VITALS — BP 110/70 | HR 88 | Temp 97.9°F | Resp 16 | Ht 63.0 in | Wt 140.2 lb

## 2018-06-28 DIAGNOSIS — Z1321 Encounter for screening for nutritional disorder: Secondary | ICD-10-CM

## 2018-06-28 DIAGNOSIS — Z2821 Immunization not carried out because of patient refusal: Secondary | ICD-10-CM | POA: Diagnosis not present

## 2018-06-28 DIAGNOSIS — Z139 Encounter for screening, unspecified: Secondary | ICD-10-CM

## 2018-06-28 DIAGNOSIS — Z0000 Encounter for general adult medical examination without abnormal findings: Secondary | ICD-10-CM

## 2018-06-28 DIAGNOSIS — M25561 Pain in right knee: Secondary | ICD-10-CM

## 2018-06-28 LAB — POCT URINALYSIS DIP (PROADVANTAGE DEVICE)
Bilirubin, UA: NEGATIVE
Glucose, UA: NEGATIVE mg/dL
Ketones, POC UA: NEGATIVE mg/dL
Leukocytes, UA: NEGATIVE
NITRITE UA: POSITIVE — AB
PH UA: 6 (ref 5.0–8.0)
PROTEIN UA: NEGATIVE mg/dL
Specific Gravity, Urine: 1.03
UUROB: NEGATIVE
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
## 2018-06-29 LAB — COMPREHENSIVE METABOLIC PANEL
A/G RATIO: 1.4 (ref 1.2–2.2)
ALK PHOS: 65 IU/L (ref 39–117)
ALT: 10 IU/L (ref 0–32)
AST: 21 IU/L (ref 0–40)
Albumin: 4.5 g/dL (ref 3.5–5.5)
BUN/Creatinine Ratio: 12 (ref 9–23)
BUN: 11 mg/dL (ref 6–20)
Bilirubin Total: 0.3 mg/dL (ref 0.0–1.2)
CALCIUM: 9.5 mg/dL (ref 8.7–10.2)
CHLORIDE: 99 mmol/L (ref 96–106)
CO2: 25 mmol/L (ref 20–29)
Creatinine, Ser: 0.93 mg/dL (ref 0.57–1.00)
GFR calc Af Amer: 95 mL/min/{1.73_m2} (ref >59)
GFR, EST NON AFRICAN AMERICAN: 82 mL/min/{1.73_m2} (ref >59)
Globulin, Total: 3.2 g/dL (ref 1.5–4.5)
Glucose: 67 mg/dL (ref 65–99)
Potassium: 4 mmol/L (ref 3.5–5.2)
Sodium: 141 mmol/L (ref 134–144)
Total Protein: 7.7 g/dL (ref 6.0–8.5)

## 2018-06-29 LAB — CBC
HEMATOCRIT: 41.2 % (ref 34.0–46.6)
HEMOGLOBIN: 13.9 g/dL (ref 11.1–15.9)
MCH: 30 pg (ref 26.6–33.0)
MCHC: 33.7 g/dL (ref 31.5–35.7)
MCV: 89 fL (ref 79–97)
Platelets: 309 10*3/uL (ref 150–450)
RBC: 4.64 x10E6/uL (ref 3.77–5.28)
RDW: 12 % — ABNORMAL LOW (ref 12.3–15.4)
WBC: 5.9 10*3/uL (ref 3.4–10.8)

## 2018-06-29 LAB — HEPATITIS B SURFACE ANTIBODY, QUANTITATIVE: HEPATITIS B SURF AB QUANT: 579.8 m[IU]/mL

## 2018-06-29 LAB — MEASLES/MUMPS/RUBELLA IMMUNITY
MUMPS ABS, IGG: 194 [AU]/ml (ref >10.9)
RUBEOLA AB, IGG: 171 [AU]/ml (ref >16.4)
Rubella Antibodies, IGG: 3.4 index (ref >0.99)

## 2018-06-29 LAB — VITAMIN D 25 HYDROXY (VIT D DEFICIENCY, FRACTURES): Vit D, 25-Hydroxy: 20 ng/mL — ABNORMAL LOW (ref 30.0–100.0)

## 2018-07-01 ENCOUNTER — Telehealth: Payer: Self-pay | Admitting: Medical

## 2018-07-01 ENCOUNTER — Other Ambulatory Visit: Payer: Self-pay | Admitting: Medical

## 2018-07-01 MED ORDER — VITAMIN D 1000 UNITS PO TABS: 1000.0000 [IU] | ORAL_TABLET | Freq: Every day | ORAL | 3 refills | Status: DC

## 2018-07-01 MED ORDER — NITROFURANTOIN MONOHYD MACRO 100 MG PO CAPS: 100.0000 mg | ORAL_CAPSULE | Freq: Two times a day (BID) | ORAL | 0 refills | Status: AC

## 2018-07-01 NOTE — Telephone Encounter (Signed)
Let us have her do a round of Macrobid antibiotic for urinary tract infection.  I would recommend she come in for a clean-catch urine sample in 2 weeks to make sure it is normal since she initially did not have symptoms

## 2018-07-01 NOTE — Telephone Encounter (Signed)
See other lab message from today.  Her urinalysis actually showed nitrites and bacteria suggestive of possible urinary tract infection.  See if she has any symptoms of urinary tract infection such as hurting when she urinates, odor in the urine, cloudy urine, urinary frequency?

## 2018-07-01 NOTE — Telephone Encounter (Signed)
Patient states that she has noticed some cloudiness of the urine in the last couple of days.

## 2018-07-01 NOTE — Telephone Encounter (Signed)
Patient notified that RX sent in to pharmacy.

## 2018-07-15 ENCOUNTER — Other Ambulatory Visit: Payer: BC Managed Care – PPO

## 2018-07-16 ENCOUNTER — Other Ambulatory Visit (INDEPENDENT_AMBULATORY_CARE_PROVIDER_SITE_OTHER): Payer: BC Managed Care – PPO

## 2018-07-16 DIAGNOSIS — R399 Unspecified symptoms and signs involving the genitourinary system: Secondary | ICD-10-CM

## 2018-07-16 LAB — POCT URINALYSIS DIP (PROADVANTAGE DEVICE)
BILIRUBIN UA: NEGATIVE mg/dL
Bilirubin, UA: NEGATIVE
Blood, UA: NEGATIVE
Glucose, UA: NEGATIVE mg/dL
Leukocytes, UA: NEGATIVE
Nitrite, UA: NEGATIVE
Protein Ur, POC: NEGATIVE mg/dL
SPECIFIC GRAVITY, URINE: 1.25
Urobilinogen, Ur: 3.5
pH, UA: 6 (ref 5.0–8.0)

## 2019-03-24 ENCOUNTER — Other Ambulatory Visit: Payer: Self-pay

## 2019-03-24 ENCOUNTER — Ambulatory Visit: Payer: BC Managed Care – PPO | Admitting: Medical

## 2019-03-24 ENCOUNTER — Encounter: Payer: Self-pay | Admitting: Medical

## 2019-03-24 VITALS — Temp 98.4°F | Ht 63.0 in | Wt 148.0 lb

## 2019-03-24 DIAGNOSIS — R070 Pain in throat: Secondary | ICD-10-CM

## 2019-03-24 NOTE — Progress Notes (Signed)
Subjective:     Patient ID: Jodi Sanchez, female   DOB: 04/11/1986, 33 y.o.   MRN: [id number]  This visit type was conducted due to national recommendations for restrictions regarding the COVID-19 Pandemic (e.g. social distancing) in an effort to limit this patient's exposure and mitigate transmission in our community.  Due to their co-morbid illnesses, this patient is at least at moderate risk for complications without adequate follow up.  This format is felt to be most appropriate for this patient at this time.    Documentation for virtual audio and video telecommunications through Zoom encounter:  The patient was located at home. The provider was located in the office. The patient did consent to this visit and is aware of possible charges through their insurance for this visit.  The other persons participating in this telemedicine service were none. Time spent on call was 10 minutes and in review of previous records >10 minutes total.  This virtual service is not related to other E/M service within previous 7 days.   HPI Chief Complaint  Patient presents with  . Sore Throat    Friday-Sunday, denies sore throat today, no fever, no cough or sneezing   Virtual consult today for throat discomfort.  She mainly had symptoms 3 days ago for about 2 days.  The symptoms resolved.  Her only symptom was throat discomfort that went away.  She reports having no other symptoms. Denies cough, no runny nose, no fever, no body aches, no chills, no SOB, no headache, no change in smell or taste.  No sick contacts.  No Covid contacts.   She has only been around close family members.  She and her family have been good about wearing mask when out in the grocery store, have limited their social contacts.  She is concerned as her 10yo daughter has nephrotic syndrome, suppressed immune system.    She is currently on her menstrual cycle.   Other associated factors regarding throat discomfort is that her  husband does a lot of grilling outside and has been using his uses smoker, and for exercise she does walk outside daily.  So it is possible the pollen has aggravated her throat.  No other aggravating or relieving factors. No other complaint.  Review of Systems As in subjective    Objective:   Physical Exam Due to coronavirus pandemic stay at home measures, patient visit was virtual and they were not examined in person.   Temp 98.4 F (36.9 C) (Oral)   Ht 5\' 3"  (1.6 m)   Wt 148 lb (67.1 kg)   BMI 26.22 kg/m       Assessment:     Encounter Diagnosis  Name Primary?  . Throat discomfort Yes       Plan:     We discussed her concerns.  Her symptoms have resolved at this point.  She has no new symptoms.  She has been careful in general as far as COVID precautions.  She is mainly concerned because of her daughters health conditions.  She has no COVID specific contacts or COVID exposures.  I advised good hydration, consider antihistamine if potential concern for allergen exposure.  We discussed that she does not necessarily need to be tested since the symptoms are not suggestive particularly of COVID-19.  We did discuss COVID-19 symptoms in general and what symptoms to look out for.  We did discuss testing availability in the area for COVID-19 if she was concerned or interested in getting tested now  or in the future if a concern arises.  F/u prn.  Jodi Sanchez was seen today for sore throat.  Diagnoses and all orders for this visit:  Throat discomfort

## 2019-04-04 ENCOUNTER — Telehealth: Payer: Self-pay | Admitting: Family Medicine

## 2019-04-04 NOTE — Telephone Encounter (Signed)
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 Sending back a copy of email and form to be completed. Please return to pt via email.

## 2019-04-14 ENCOUNTER — Telehealth: Payer: Self-pay | Admitting: Medical

## 2019-04-14 NOTE — Telephone Encounter (Signed)
I received a request to sign a form verifying that she has increased risk factors related to COVID-19.  Looking over her chart record, I do not see where she really has any significant underlying health issues.  Please call and asked what concerns she has, or what significant health issues she is referring to?  Generally we are more concerned with Covid risks related to asthma, diabetes, heart disease, hypertension, cancer, immunosuppression and things that would make it more likely for her to not fight off illness.

## 2019-04-15 NOTE — Telephone Encounter (Signed)
Patient states that it is completely ok.  She does not have any of these health risk factors.

## 2019-04-15 NOTE — Telephone Encounter (Signed)
ok 

## 2019-07-14 LAB — HM PAP SMEAR

## 2019-07-22 LAB — HM PAP SMEAR: HM Pap smear: NORMAL

## 2019-07-22 LAB — RESULTS CONSOLE HPV: CHL HPV: NEGATIVE

## 2019-12-22 ENCOUNTER — Ambulatory Visit: Payer: BC Managed Care – PPO

## 2019-12-22 ENCOUNTER — Ambulatory Visit: Payer: BC Managed Care – PPO | Attending: Internal Medicine

## 2019-12-22 ENCOUNTER — Other Ambulatory Visit: Payer: Self-pay

## 2019-12-22 DIAGNOSIS — Z23 Encounter for immunization: Secondary | ICD-10-CM

## 2019-12-22 NOTE — Progress Notes (Signed)
   Covid-19 Vaccination Clinic  Name:  Jodi Sanchez    MRN: [id number] DOB: 01/22/1986  12/22/2019  [name] was observed post Covid-19 immunization for 15 minutes without incident. She was provided with Vaccine Information Sheet and instruction to access the V-Safe system.   [name] was instructed to call 911 with any severe reactions post vaccine: Marland Kitchen Difficulty breathing  . Swelling of face and throat  . A fast heartbeat  . A bad rash all over body  . Dizziness and weakness   Immunizations Administered    Name Date Dose VIS Date Route   Pfizer COVID-19 Vaccine 12/22/2019  8:20 AM 0.3 mL 08/29/2019 Intramuscular   Manufacturer: [employer], [employer]   Lot: [id number]   NDC: [address]-2

## 2019-12-29 ENCOUNTER — Ambulatory Visit: Payer: BC Managed Care – PPO

## 2020-01-14 ENCOUNTER — Ambulatory Visit: Payer: BC Managed Care – PPO | Attending: Internal Medicine

## 2020-01-14 DIAGNOSIS — Z23 Encounter for immunization: Secondary | ICD-10-CM

## 2020-01-14 NOTE — Progress Notes (Signed)
   Covid-19 Vaccination Clinic  Name:  RIANN OMAN    MRN: [id number] DOB: [birth year]  01/14/2020  Ms. Rodenberg was observed post Covid-19 immunization for 15 minutes without incident. She was provided with Vaccine Information Sheet and instruction to access the V-Safe system.   Ms. Yore was instructed to call 911 with any severe reactions post vaccine: Marland Kitchen Difficulty breathing  . Swelling of face and throat  . A fast heartbeat  . A bad rash all over body  . Dizziness and weakness   Immunizations Administered    Name Date Dose VIS Date Route   Pfizer COVID-19 Vaccine 01/14/2020  9:37 AM 0.3 mL 11/12/2018 Intramuscular   Manufacturer: [employer], [employer]   Lot: [id number]   NDC: [address]-6

## 2021-03-06 ENCOUNTER — Emergency Department: Payer: BC Managed Care – PPO

## 2021-03-06 ENCOUNTER — Other Ambulatory Visit: Payer: Self-pay

## 2021-03-06 ENCOUNTER — Emergency Department
Admission: EM | Admit: 2021-03-06 | Discharge: 2021-03-06 | Disposition: A | Discharge status: Home / Self Care | Payer: BC Managed Care – PPO | Attending: Emergency Medicine | Admitting: Emergency Medicine

## 2021-03-06 ENCOUNTER — Encounter: Payer: Self-pay | Admitting: Emergency Medicine

## 2021-03-06 DIAGNOSIS — R079 Chest pain, unspecified: Secondary | ICD-10-CM

## 2021-03-06 DIAGNOSIS — R0789 Other chest pain: Secondary | ICD-10-CM | POA: Diagnosis present

## 2021-03-06 LAB — MAGNESIUM: Magnesium: 2.1 mg/dL (ref 1.7–2.4)

## 2021-03-06 LAB — BASIC METABOLIC PANEL
Anion gap: 7 (ref 5–15)
BUN: 10 mg/dL (ref 6–20)
CO2: 27 mmol/L (ref 22–32)
Calcium: 9.2 mg/dL (ref 8.9–10.3)
Chloride: 102 mmol/L (ref 98–111)
Creatinine, Ser: 0.8 mg/dL (ref 0.44–1.00)
GFR, Estimated: >60 mL/min (ref >60)
Glucose, Bld: 90 mg/dL (ref 70–99)
Potassium: 3.7 mmol/L (ref 3.5–5.1)
Sodium: 136 mmol/L (ref 135–145)

## 2021-03-06 LAB — CBC
HCT: 40.1 % (ref 36.0–46.0)
Hemoglobin: 13.6 g/dL (ref 12.0–15.0)
MCH: 29.2 pg (ref 26.0–34.0)
MCHC: 33.9 g/dL (ref 30.0–36.0)
MCV: 86.2 fL (ref 80.0–100.0)
Platelets: 276 10*3/uL (ref 150–400)
RBC: 4.65 MIL/uL (ref 3.87–5.11)
RDW: 11.9 % (ref 11.5–15.5)
WBC: 7.9 10*3/uL (ref 4.0–10.5)
nRBC: 0 % (ref 0.0–0.2)

## 2021-03-06 LAB — TROPONIN I (HIGH SENSITIVITY)
Troponin I (High Sensitivity): 3 ng/L (ref <18)
Troponin I (High Sensitivity): 3 ng/L (ref <18)

## 2021-03-06 LAB — D-DIMER, QUANTITATIVE: D-Dimer, Quant: <0.27 ug/mL-FEU (ref 0.00–0.50)

## 2021-03-06 MED ORDER — METHOCARBAMOL 500 MG PO TABS: 500.0000 mg | ORAL_TABLET | Freq: Three times a day (TID) | ORAL | 0 refills | Status: AC | PRN

## 2021-03-06 MED ORDER — MELOXICAM 15 MG PO TABS: 15.0000 mg | ORAL_TABLET | Freq: Every day | ORAL | 2 refills | Status: DC

## 2021-03-06 MED ORDER — METHOCARBAMOL 500 MG PO TABS
1000.0000 mg | ORAL_TABLET | Freq: Once | ORAL | Status: AC
Start: 2021-03-06 — End: 2021-03-06
  Administered 2021-03-06: 1000 mg via ORAL
  Filled 2021-03-06: qty 2

## 2021-03-06 MED ORDER — KETOROLAC TROMETHAMINE 30 MG/ML IJ SOLN
30.0000 mg | Freq: Once | INTRAMUSCULAR | Status: AC
  Administered 2021-03-06: 30 mg via INTRAMUSCULAR
  Filled 2021-03-06: qty 1

## 2021-03-06 NOTE — ED Triage Notes (Signed)
Pt reports a sharp pinching pain under her left breast and in her left shoulder. Pt denies injuries. Pt states the pain started last pm and hasn't really one away.
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
## 2021-03-06 NOTE — Discharge Instructions (Addendum)
Take meloxicam and Robaxin as directed. 
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
## 2021-03-16 ENCOUNTER — Other Ambulatory Visit: Payer: Self-pay

## 2021-03-16 ENCOUNTER — Encounter: Payer: Self-pay | Admitting: Family Medicine

## 2021-03-16 ENCOUNTER — Ambulatory Visit: Payer: BC Managed Care – PPO | Admitting: Family Medicine

## 2021-03-16 VITALS — BP 120/82 | HR 72 | Ht 63.0 in | Wt 162.8 lb

## 2021-03-16 DIAGNOSIS — R0789 Other chest pain: Secondary | ICD-10-CM

## 2021-03-16 DIAGNOSIS — M25512 Pain in left shoulder: Secondary | ICD-10-CM

## 2021-03-16 NOTE — Patient Instructions (Signed)
  Take the meloxicam once daily with food--take tonight with dinner. Take the methocarbamol (robaxin) at bedtime, and every 8 hours only if needed for muscle spasm/pain. You may continue to use heat as needed. You may also benefit from topical medications such as Biofreeze or [employer], or SalonPas with lidocaine. You may also use Tylenol along with the other medications, if needed for pain.  Contact us if your pain isn't improving with these measures. Next step will likely be referral to physical therapy (assuming that your symptoms remain about the same)

## 2021-08-03 ENCOUNTER — Telehealth: Payer: BC Managed Care – PPO | Admitting: Medical

## 2021-08-03 ENCOUNTER — Other Ambulatory Visit: Payer: Self-pay

## 2021-08-03 VITALS — Temp 97.8°F | Wt 160.0 lb

## 2021-08-03 DIAGNOSIS — R6889 Other general symptoms and signs: Secondary | ICD-10-CM

## 2021-08-03 DIAGNOSIS — Z20828 Contact with and (suspected) exposure to other viral communicable diseases: Secondary | ICD-10-CM | POA: Diagnosis not present

## 2021-08-03 MED ORDER — EMERGEN-C IMMUNE PLUS PO PACK: 1.0000 | PACK | Freq: Two times a day (BID) | ORAL | 0 refills | Status: DC

## 2021-08-03 MED ORDER — OSELTAMIVIR PHOSPHATE 75 MG PO CAPS: 75.0000 mg | ORAL_CAPSULE | Freq: Two times a day (BID) | ORAL | 0 refills | Status: AC
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
## 2021-09-08 ENCOUNTER — Encounter: Payer: Self-pay | Admitting: Medical

## 2021-09-08 ENCOUNTER — Telehealth: Payer: BC Managed Care – PPO | Admitting: Medical

## 2021-09-08 ENCOUNTER — Other Ambulatory Visit: Payer: Self-pay

## 2021-09-08 VITALS — Temp 98.0°F | Wt 165.0 lb

## 2021-09-08 DIAGNOSIS — J0110 Acute frontal sinusitis, unspecified: Secondary | ICD-10-CM

## 2021-09-08 MED ORDER — AMOXICILLIN 875 MG PO TABS: 875.0000 mg | ORAL_TABLET | Freq: Two times a day (BID) | ORAL | 0 refills | Status: AC
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
## 2021-10-05 ENCOUNTER — Encounter: Payer: Self-pay | Admitting: Internal Medicine

## 2021-10-05 ENCOUNTER — Telehealth: Payer: Self-pay

## 2021-10-05 NOTE — Telephone Encounter (Signed)
Received notes from [facility] and I request her last PAP 2020.

## 2022-06-27 ENCOUNTER — Encounter: Payer: Self-pay | Admitting: Internal Medicine

## 2023-04-15 LAB — RESULTS CONSOLE HPV: CHL HPV: NEGATIVE

## 2023-04-15 LAB — HM PAP SMEAR: HM Pap smear: NEGATIVE

## 2023-04-17 ENCOUNTER — Encounter: Payer: Self-pay | Admitting: Internal Medicine

## 2023-04-18 ENCOUNTER — Telehealth: Payer: Self-pay | Admitting: Medical

## 2023-04-18 ENCOUNTER — Encounter: Payer: Self-pay | Admitting: Internal Medicine

## 2023-04-18 NOTE — Telephone Encounter (Signed)
Requested records received from [facility] and sent back in folder

## 2023-04-19 ENCOUNTER — Encounter: Payer: Self-pay | Admitting: Internal Medicine
